# Patient Record
Sex: Female | Born: 1970 | Race: White | Hispanic: Yes | Marital: Married | Smoking: Never smoker
Health system: Southern US, Community
[De-identification: ages and names within clinical notes are randomized; demographics above are authoritative.]

## PROBLEM LIST (undated history)

## (undated) DIAGNOSIS — F99 Mental disorder, not otherwise specified: Secondary | ICD-10-CM

## (undated) DIAGNOSIS — F32A Depression, unspecified: Secondary | ICD-10-CM

## (undated) DIAGNOSIS — B019 Varicella without complication: Secondary | ICD-10-CM

## (undated) HISTORY — DX: Depression, unspecified: F32.A

## (undated) HISTORY — DX: Mental disorder, not otherwise specified: F99

## (undated) HISTORY — DX: Varicella without complication: B01.9

---

## 2011-01-17 ENCOUNTER — Other Ambulatory Visit: Payer: Self-pay | Admitting: Obstetrics & Gynecology

## 2011-01-17 ENCOUNTER — Ambulatory Visit: Payer: Self-pay

## 2011-01-17 ENCOUNTER — Ambulatory Visit (HOSPITAL_COMMUNITY)
Admission: RE | Admit: 2011-01-17 | Discharge: 2011-01-17 | Disposition: A | Discharge status: Home / Self Care | Payer: Medicaid Other | Source: Ambulatory Visit | Attending: Obstetrics & Gynecology | Admitting: Obstetrics & Gynecology

## 2011-01-17 ENCOUNTER — Encounter (INDEPENDENT_AMBULATORY_CARE_PROVIDER_SITE_OTHER): Payer: Self-pay | Admitting: *Deleted

## 2011-01-17 DIAGNOSIS — O26849 Uterine size-date discrepancy, unspecified trimester: Secondary | ICD-10-CM

## 2011-01-17 DIAGNOSIS — O99210 Obesity complicating pregnancy, unspecified trimester: Secondary | ICD-10-CM | POA: Insufficient documentation

## 2011-01-17 DIAGNOSIS — Z331 Pregnant state, incidental: Secondary | ICD-10-CM

## 2011-01-17 DIAGNOSIS — E669 Obesity, unspecified: Secondary | ICD-10-CM | POA: Insufficient documentation

## 2011-01-17 DIAGNOSIS — O0930 Supervision of pregnancy with insufficient antenatal care, unspecified trimester: Secondary | ICD-10-CM | POA: Insufficient documentation

## 2011-01-17 DIAGNOSIS — O09529 Supervision of elderly multigravida, unspecified trimester: Secondary | ICD-10-CM | POA: Insufficient documentation

## 2011-01-17 DIAGNOSIS — O30009 Twin pregnancy, unspecified number of placenta and unspecified number of amniotic sacs, unspecified trimester: Secondary | ICD-10-CM | POA: Insufficient documentation

## 2011-01-17 LAB — CONVERTED CEMR LAB
ALT: 12 units/L (ref 0–35)
AST: 17 units/L (ref 0–37)
Albumin: 3.4 g/dL — ABNORMAL LOW (ref 3.5–5.2)
Antibody Screen: NEGATIVE
Calcium: 8.9 mg/dL (ref 8.4–10.5)
Chloride: 107 meq/L (ref 96–112)
Eosinophils Absolute: 0.2 10*3/uL (ref 0.0–0.7)
Eosinophils Relative: 1 % (ref 0–5)
HCT: 36 % (ref 36.0–46.0)
Hepatitis B Surface Ag: NEGATIVE
Lymphs Abs: 2.5 10*3/uL (ref 0.7–4.0)
MCV: 80 fL (ref 78.0–100.0)
Monocytes Relative: 6 % (ref 3–12)
Platelets: 194 10*3/uL (ref 150–400)
Potassium: 4.7 meq/L (ref 3.5–5.3)
Rh Type: POSITIVE
WBC: 12.8 10*3/uL — ABNORMAL HIGH (ref 4.0–10.5)

## 2011-01-18 ENCOUNTER — Encounter: Payer: Self-pay | Admitting: Family

## 2011-01-18 ENCOUNTER — Other Ambulatory Visit: Payer: Self-pay

## 2011-01-18 ENCOUNTER — Ambulatory Visit (HOSPITAL_COMMUNITY)
Admission: RE | Admit: 2011-01-18 | Discharge: 2011-01-18 | Disposition: A | Discharge status: Home / Self Care | Payer: Medicaid Other | Source: Ambulatory Visit | Attending: Family Medicine | Admitting: Family Medicine

## 2011-01-18 ENCOUNTER — Encounter: Payer: Self-pay | Admitting: Obstetrics and Gynecology

## 2011-01-18 ENCOUNTER — Other Ambulatory Visit: Payer: Self-pay | Admitting: Family Medicine

## 2011-01-18 DIAGNOSIS — O30009 Twin pregnancy, unspecified number of placenta and unspecified number of amniotic sacs, unspecified trimester: Secondary | ICD-10-CM | POA: Insufficient documentation

## 2011-01-18 DIAGNOSIS — O0930 Supervision of pregnancy with insufficient antenatal care, unspecified trimester: Secondary | ICD-10-CM | POA: Insufficient documentation

## 2011-01-18 DIAGNOSIS — E669 Obesity, unspecified: Secondary | ICD-10-CM | POA: Insufficient documentation

## 2011-01-18 DIAGNOSIS — O169 Unspecified maternal hypertension, unspecified trimester: Secondary | ICD-10-CM

## 2011-01-18 DIAGNOSIS — O09529 Supervision of elderly multigravida, unspecified trimester: Secondary | ICD-10-CM | POA: Insufficient documentation

## 2011-01-18 LAB — POCT URINALYSIS DIPSTICK
Bilirubin Urine: NEGATIVE
Hgb urine dipstick: NEGATIVE
Nitrite: POSITIVE — AB
Urine Glucose, Fasting: NEGATIVE mg/dL

## 2011-01-18 LAB — CONVERTED CEMR LAB: Protein, 24H Urine: 198 mg/24hr — ABNORMAL HIGH (ref 50–100)

## 2011-01-19 ENCOUNTER — Encounter: Payer: Self-pay | Admitting: Family

## 2011-01-19 DIAGNOSIS — O99810 Abnormal glucose complicating pregnancy: Secondary | ICD-10-CM

## 2011-01-19 DIAGNOSIS — O10019 Pre-existing essential hypertension complicating pregnancy, unspecified trimester: Secondary | ICD-10-CM

## 2011-01-19 DIAGNOSIS — O30009 Twin pregnancy, unspecified number of placenta and unspecified number of amniotic sacs, unspecified trimester: Secondary | ICD-10-CM

## 2011-01-20 ENCOUNTER — Other Ambulatory Visit: Payer: Self-pay

## 2011-01-20 ENCOUNTER — Other Ambulatory Visit: Payer: Self-pay | Admitting: Obstetrics & Gynecology

## 2011-01-20 ENCOUNTER — Encounter: Payer: Self-pay | Admitting: Obstetrics & Gynecology

## 2011-01-20 DIAGNOSIS — O139 Gestational [pregnancy-induced] hypertension without significant proteinuria, unspecified trimester: Secondary | ICD-10-CM

## 2011-01-20 DIAGNOSIS — IMO0001 Reserved for inherently not codable concepts without codable children: Secondary | ICD-10-CM

## 2011-01-23 ENCOUNTER — Other Ambulatory Visit: Payer: Self-pay

## 2011-01-23 ENCOUNTER — Other Ambulatory Visit: Payer: Self-pay | Admitting: Obstetrics & Gynecology

## 2011-01-23 ENCOUNTER — Ambulatory Visit (HOSPITAL_COMMUNITY)
Admission: RE | Admit: 2011-01-23 | Discharge: 2011-01-23 | Disposition: A | Discharge status: Home / Self Care | Payer: Medicaid Other | Source: Ambulatory Visit | Attending: Obstetrics & Gynecology | Admitting: Obstetrics & Gynecology

## 2011-01-23 DIAGNOSIS — O30009 Twin pregnancy, unspecified number of placenta and unspecified number of amniotic sacs, unspecified trimester: Secondary | ICD-10-CM

## 2011-01-23 DIAGNOSIS — O0930 Supervision of pregnancy with insufficient antenatal care, unspecified trimester: Secondary | ICD-10-CM | POA: Insufficient documentation

## 2011-01-23 DIAGNOSIS — O99330 Smoking (tobacco) complicating pregnancy, unspecified trimester: Secondary | ICD-10-CM | POA: Insufficient documentation

## 2011-01-23 DIAGNOSIS — O99210 Obesity complicating pregnancy, unspecified trimester: Secondary | ICD-10-CM | POA: Insufficient documentation

## 2011-01-23 DIAGNOSIS — IMO0001 Reserved for inherently not codable concepts without codable children: Secondary | ICD-10-CM

## 2011-01-23 DIAGNOSIS — O09529 Supervision of elderly multigravida, unspecified trimester: Secondary | ICD-10-CM | POA: Insufficient documentation

## 2011-01-23 DIAGNOSIS — E669 Obesity, unspecified: Secondary | ICD-10-CM | POA: Insufficient documentation

## 2011-01-23 LAB — POCT URINALYSIS DIPSTICK
Hgb urine dipstick: NEGATIVE
Ketones, ur: NEGATIVE mg/dL
Protein, ur: 100 mg/dL — AB
Specific Gravity, Urine: 1.02 (ref 1.005–1.030)

## 2011-01-26 ENCOUNTER — Ambulatory Visit (HOSPITAL_COMMUNITY)
Admission: RE | Admit: 2011-01-26 | Discharge: 2011-01-26 | Disposition: A | Discharge status: Home / Self Care | Payer: Medicaid Other | Source: Ambulatory Visit | Attending: Obstetrics & Gynecology | Admitting: Obstetrics & Gynecology

## 2011-01-26 ENCOUNTER — Other Ambulatory Visit: Payer: Self-pay | Admitting: Obstetrics & Gynecology

## 2011-01-26 ENCOUNTER — Encounter (HOSPITAL_COMMUNITY): Payer: Self-pay

## 2011-01-26 DIAGNOSIS — O30009 Twin pregnancy, unspecified number of placenta and unspecified number of amniotic sacs, unspecified trimester: Secondary | ICD-10-CM | POA: Insufficient documentation

## 2011-01-26 DIAGNOSIS — O09529 Supervision of elderly multigravida, unspecified trimester: Secondary | ICD-10-CM | POA: Insufficient documentation

## 2011-01-26 DIAGNOSIS — E669 Obesity, unspecified: Secondary | ICD-10-CM | POA: Insufficient documentation

## 2011-01-26 DIAGNOSIS — O99330 Smoking (tobacco) complicating pregnancy, unspecified trimester: Secondary | ICD-10-CM | POA: Insufficient documentation

## 2011-01-26 DIAGNOSIS — IMO0001 Reserved for inherently not codable concepts without codable children: Secondary | ICD-10-CM

## 2011-01-26 DIAGNOSIS — O0930 Supervision of pregnancy with insufficient antenatal care, unspecified trimester: Secondary | ICD-10-CM | POA: Insufficient documentation

## 2011-01-30 ENCOUNTER — Other Ambulatory Visit: Payer: Self-pay

## 2011-01-30 DIAGNOSIS — O34219 Maternal care for unspecified type scar from previous cesarean delivery: Secondary | ICD-10-CM

## 2011-01-30 DIAGNOSIS — O30009 Twin pregnancy, unspecified number of placenta and unspecified number of amniotic sacs, unspecified trimester: Secondary | ICD-10-CM

## 2011-01-30 DIAGNOSIS — Z87891 Personal history of nicotine dependence: Secondary | ICD-10-CM

## 2011-01-30 DIAGNOSIS — O09529 Supervision of elderly multigravida, unspecified trimester: Secondary | ICD-10-CM

## 2011-01-30 LAB — POCT URINALYSIS DIPSTICK
Glucose, UA: NEGATIVE mg/dL
Hgb urine dipstick: NEGATIVE
Nitrite: NEGATIVE
Urobilinogen, UA: 0.2 mg/dL (ref 0.0–1.0)

## 2011-01-31 ENCOUNTER — Ambulatory Visit (HOSPITAL_COMMUNITY)
Admission: RE | Admit: 2011-01-31 | Discharge: 2011-01-31 | Payer: Medicaid Other | Source: Ambulatory Visit | Attending: Family Medicine | Admitting: Family Medicine

## 2011-01-31 ENCOUNTER — Other Ambulatory Visit: Payer: Self-pay | Admitting: Family Medicine

## 2011-01-31 DIAGNOSIS — O24419 Gestational diabetes mellitus in pregnancy, unspecified control: Secondary | ICD-10-CM

## 2011-01-31 DIAGNOSIS — IMO0001 Reserved for inherently not codable concepts without codable children: Secondary | ICD-10-CM

## 2011-02-02 ENCOUNTER — Encounter (HOSPITAL_COMMUNITY)
Admission: RE | Admit: 2011-02-02 | Discharge: 2011-02-02 | Disposition: A | Discharge status: Home / Self Care | Payer: Medicaid Other | Source: Ambulatory Visit | Attending: Obstetrics and Gynecology | Admitting: Obstetrics and Gynecology

## 2011-02-02 ENCOUNTER — Other Ambulatory Visit: Payer: Self-pay | Admitting: Family Medicine

## 2011-02-02 ENCOUNTER — Ambulatory Visit (HOSPITAL_COMMUNITY)
Admission: RE | Admit: 2011-02-02 | Discharge: 2011-02-02 | Disposition: A | Discharge status: Home / Self Care | Payer: Medicaid Other | Source: Ambulatory Visit | Attending: Family Medicine | Admitting: Family Medicine

## 2011-02-02 DIAGNOSIS — IMO0001 Reserved for inherently not codable concepts without codable children: Secondary | ICD-10-CM

## 2011-02-02 DIAGNOSIS — O30009 Twin pregnancy, unspecified number of placenta and unspecified number of amniotic sacs, unspecified trimester: Secondary | ICD-10-CM | POA: Insufficient documentation

## 2011-02-02 DIAGNOSIS — O34219 Maternal care for unspecified type scar from previous cesarean delivery: Secondary | ICD-10-CM | POA: Insufficient documentation

## 2011-02-02 DIAGNOSIS — O24419 Gestational diabetes mellitus in pregnancy, unspecified control: Secondary | ICD-10-CM

## 2011-02-02 DIAGNOSIS — O09529 Supervision of elderly multigravida, unspecified trimester: Secondary | ICD-10-CM | POA: Insufficient documentation

## 2011-02-02 DIAGNOSIS — O0930 Supervision of pregnancy with insufficient antenatal care, unspecified trimester: Secondary | ICD-10-CM | POA: Insufficient documentation

## 2011-02-02 DIAGNOSIS — E669 Obesity, unspecified: Secondary | ICD-10-CM | POA: Insufficient documentation

## 2011-02-02 LAB — CBC
MCH: 24.4 pg — ABNORMAL LOW (ref 26.0–34.0)
Platelets: 198 10*3/uL (ref 150–400)
RBC: 4.79 MIL/uL (ref 3.87–5.11)

## 2011-02-02 LAB — SURGICAL PCR SCREEN: Staphylococcus aureus: NEGATIVE

## 2011-02-06 ENCOUNTER — Other Ambulatory Visit: Payer: Self-pay

## 2011-02-06 ENCOUNTER — Inpatient Hospital Stay (HOSPITAL_COMMUNITY)
Admission: AD | Admit: 2011-02-06 | Discharge: 2011-02-06 | Disposition: A | Discharge status: Home / Self Care | Payer: Medicaid Other | Source: Ambulatory Visit | Attending: Obstetrics and Gynecology | Admitting: Obstetrics and Gynecology

## 2011-02-06 ENCOUNTER — Inpatient Hospital Stay (HOSPITAL_COMMUNITY): Payer: Medicaid Other

## 2011-02-06 DIAGNOSIS — O99891 Other specified diseases and conditions complicating pregnancy: Secondary | ICD-10-CM | POA: Insufficient documentation

## 2011-02-06 DIAGNOSIS — R030 Elevated blood-pressure reading, without diagnosis of hypertension: Secondary | ICD-10-CM

## 2011-02-06 DIAGNOSIS — O169 Unspecified maternal hypertension, unspecified trimester: Secondary | ICD-10-CM

## 2011-02-06 DIAGNOSIS — O9989 Other specified diseases and conditions complicating pregnancy, childbirth and the puerperium: Secondary | ICD-10-CM

## 2011-02-06 DIAGNOSIS — O30009 Twin pregnancy, unspecified number of placenta and unspecified number of amniotic sacs, unspecified trimester: Secondary | ICD-10-CM

## 2011-02-06 LAB — CBC
MCH: 24.4 pg — ABNORMAL LOW (ref 26.0–34.0)
MCHC: 31.3 g/dL (ref 30.0–36.0)
Platelets: 216 10*3/uL (ref 150–400)
RBC: 4.64 MIL/uL (ref 3.87–5.11)
RDW: 16 % — ABNORMAL HIGH (ref 11.5–15.5)

## 2011-02-06 LAB — COMPREHENSIVE METABOLIC PANEL
Alkaline Phosphatase: 238 U/L — ABNORMAL HIGH (ref 39–117)
BUN: 7 mg/dL (ref 6–23)
CO2: 22 mEq/L (ref 19–32)
Chloride: 108 mEq/L (ref 96–112)
Creatinine, Ser: 0.75 mg/dL (ref 0.4–1.2)
GFR calc non Af Amer: >60 mL/min (ref >60)
Total Bilirubin: 0.2 mg/dL — ABNORMAL LOW (ref 0.3–1.2)

## 2011-02-06 LAB — PROTEIN / CREATININE RATIO, URINE
Creatinine, Urine: 146.8 mg/dL
Total Protein, Urine: 60 mg/dL

## 2011-02-06 LAB — POCT URINALYSIS DIP (DEVICE)
Glucose, UA: NEGATIVE mg/dL
Hgb urine dipstick: NEGATIVE
Nitrite: NEGATIVE
Urobilinogen, UA: 0.2 mg/dL (ref 0.0–1.0)
pH: 6.5 (ref 5.0–8.0)

## 2011-02-06 LAB — URINALYSIS, ROUTINE W REFLEX MICROSCOPIC
Ketones, ur: NEGATIVE mg/dL
Leukocytes, UA: NEGATIVE
Nitrite: NEGATIVE
Protein, ur: 30 mg/dL — AB
Urobilinogen, UA: 0.2 mg/dL (ref 0.0–1.0)

## 2011-02-07 ENCOUNTER — Inpatient Hospital Stay (HOSPITAL_COMMUNITY)
Admission: RE | Admit: 2011-02-07 | Discharge: 2011-02-10 | DRG: 765 | Disposition: A | Discharge status: Home / Self Care | Payer: Medicaid Other | Source: Ambulatory Visit | Attending: Obstetrics and Gynecology | Admitting: Obstetrics and Gynecology

## 2011-02-07 ENCOUNTER — Other Ambulatory Visit: Payer: Self-pay | Admitting: Obstetrics and Gynecology

## 2011-02-07 DIAGNOSIS — D649 Anemia, unspecified: Secondary | ICD-10-CM | POA: Diagnosis not present

## 2011-02-07 DIAGNOSIS — O34219 Maternal care for unspecified type scar from previous cesarean delivery: Principal | ICD-10-CM | POA: Diagnosis present

## 2011-02-07 DIAGNOSIS — E669 Obesity, unspecified: Secondary | ICD-10-CM | POA: Diagnosis present

## 2011-02-07 DIAGNOSIS — O30009 Twin pregnancy, unspecified number of placenta and unspecified number of amniotic sacs, unspecified trimester: Secondary | ICD-10-CM | POA: Diagnosis present

## 2011-02-07 DIAGNOSIS — O30049 Twin pregnancy, dichorionic/diamniotic, unspecified trimester: Secondary | ICD-10-CM

## 2011-02-07 DIAGNOSIS — O329XX0 Maternal care for malpresentation of fetus, unspecified, not applicable or unspecified: Secondary | ICD-10-CM | POA: Diagnosis present

## 2011-02-07 DIAGNOSIS — O9903 Anemia complicating the puerperium: Secondary | ICD-10-CM | POA: Diagnosis not present

## 2011-02-07 DIAGNOSIS — O09529 Supervision of elderly multigravida, unspecified trimester: Secondary | ICD-10-CM | POA: Diagnosis present

## 2011-02-07 DIAGNOSIS — O3090 Multiple gestation, unspecified, unspecified trimester: Secondary | ICD-10-CM | POA: Diagnosis present

## 2011-02-07 DIAGNOSIS — O99214 Obesity complicating childbirth: Secondary | ICD-10-CM

## 2011-02-07 LAB — TYPE AND SCREEN

## 2011-02-08 LAB — CBC
Hemoglobin: 7.9 g/dL — ABNORMAL LOW (ref 12.0–15.0)
MCH: 24.7 pg — ABNORMAL LOW (ref 26.0–34.0)
RBC: 3.2 MIL/uL — ABNORMAL LOW (ref 3.87–5.11)
WBC: 14.4 10*3/uL — ABNORMAL HIGH (ref 4.0–10.5)

## 2011-02-10 ENCOUNTER — Encounter (HOSPITAL_COMMUNITY)
Admission: RE | Admit: 2011-02-10 | Discharge: 2011-02-10 | Disposition: A | Discharge status: Home / Self Care | Payer: Medicaid Other | Source: Ambulatory Visit | Attending: Obstetrics and Gynecology | Admitting: Obstetrics and Gynecology

## 2011-02-10 DIAGNOSIS — O923 Agalactia: Secondary | ICD-10-CM | POA: Insufficient documentation

## 2011-02-12 ENCOUNTER — Inpatient Hospital Stay (HOSPITAL_COMMUNITY)
Admission: EM | Admit: 2011-02-12 | Discharge: 2011-02-13 | DRG: 776 | Disposition: A | Discharge status: Home / Self Care | Payer: Medicaid Other | Attending: Internal Medicine | Admitting: Internal Medicine

## 2011-02-12 ENCOUNTER — Emergency Department (HOSPITAL_COMMUNITY): Payer: Medicaid Other

## 2011-02-12 DIAGNOSIS — D649 Anemia, unspecified: Secondary | ICD-10-CM | POA: Diagnosis present

## 2011-02-12 DIAGNOSIS — E8809 Other disorders of plasma-protein metabolism, not elsewhere classified: Secondary | ICD-10-CM | POA: Diagnosis present

## 2011-02-12 DIAGNOSIS — I509 Heart failure, unspecified: Secondary | ICD-10-CM | POA: Diagnosis present

## 2011-02-12 DIAGNOSIS — O99893 Other specified diseases and conditions complicating puerperium: Secondary | ICD-10-CM | POA: Diagnosis present

## 2011-02-12 DIAGNOSIS — I5031 Acute diastolic (congestive) heart failure: Secondary | ICD-10-CM | POA: Diagnosis present

## 2011-02-12 DIAGNOSIS — F17200 Nicotine dependence, unspecified, uncomplicated: Secondary | ICD-10-CM | POA: Diagnosis present

## 2011-02-12 LAB — CBC
HCT: 25.7 % — ABNORMAL LOW (ref 36.0–46.0)
Hemoglobin: 8.1 g/dL — ABNORMAL LOW (ref 12.0–15.0)
MCH: 25.4 pg — ABNORMAL LOW (ref 26.0–34.0)
MCHC: 31.5 g/dL (ref 30.0–36.0)
MCV: 80.6 fL (ref 78.0–100.0)
RBC: 3.19 MIL/uL — ABNORMAL LOW (ref 3.87–5.11)

## 2011-02-12 LAB — POCT I-STAT, CHEM 8
BUN: 9 mg/dL (ref 6–23)
Calcium, Ion: 1.02 mmol/L — ABNORMAL LOW (ref 1.12–1.32)
Creatinine, Ser: 0.7 mg/dL (ref 0.4–1.2)
Glucose, Bld: 85 mg/dL (ref 70–99)
HCT: 25 % — ABNORMAL LOW (ref 36.0–46.0)
Hemoglobin: 8.5 g/dL — ABNORMAL LOW (ref 12.0–15.0)
Sodium: 142 mEq/L (ref 135–145)

## 2011-02-12 LAB — BASIC METABOLIC PANEL
CO2: 25 mEq/L (ref 19–32)
Calcium: 8 mg/dL — ABNORMAL LOW (ref 8.4–10.5)
Creatinine, Ser: 0.82 mg/dL (ref 0.4–1.2)
GFR calc Af Amer: >60 mL/min (ref >60)
GFR calc non Af Amer: >60 mL/min (ref >60)
Sodium: 139 mEq/L (ref 135–145)

## 2011-02-12 LAB — CK TOTAL AND CKMB (NOT AT ARMC)
CK, MB: 1.3 ng/mL (ref 0.3–4.0)
Relative Index: INVALID (ref 0.0–2.5)
Total CK: 80 U/L (ref 7–177)

## 2011-02-12 LAB — URINALYSIS, ROUTINE W REFLEX MICROSCOPIC
Bilirubin Urine: NEGATIVE
Glucose, UA: NEGATIVE mg/dL
Ketones, ur: NEGATIVE mg/dL
Protein, ur: NEGATIVE mg/dL
Urobilinogen, UA: 0.2 mg/dL (ref 0.0–1.0)

## 2011-02-12 LAB — DIFFERENTIAL
Basophils Relative: 1 % (ref 0–1)
Lymphocytes Relative: 15 % (ref 12–46)
Lymphs Abs: 1.9 10*3/uL (ref 0.7–4.0)
Monocytes Absolute: 0.8 10*3/uL (ref 0.1–1.0)
Monocytes Relative: 6 % (ref 3–12)
Neutro Abs: 9.5 10*3/uL — ABNORMAL HIGH (ref 1.7–7.7)
Neutrophils Relative %: 76 % (ref 43–77)

## 2011-02-12 LAB — HEPATIC FUNCTION PANEL
ALT: 21 U/L (ref 0–35)
AST: 30 U/L (ref 0–37)
Indirect Bilirubin: 0.6 mg/dL (ref 0.3–0.9)
Total Protein: 5.3 g/dL — ABNORMAL LOW (ref 6.0–8.3)

## 2011-02-12 LAB — TYPE AND SCREEN: Antibody Screen: NEGATIVE

## 2011-02-12 LAB — POCT CARDIAC MARKERS
CKMB, poc: <1 ng/mL — ABNORMAL LOW (ref 1.0–8.0)
Myoglobin, poc: 62.4 ng/mL (ref 12–200)

## 2011-02-12 LAB — TSH: TSH: 3.768 u[IU]/mL (ref 0.350–4.500)

## 2011-02-12 LAB — HEMOGLOBIN A1C
Hgb A1c MFr Bld: 5.8 % — ABNORMAL HIGH (ref <5.7)
Mean Plasma Glucose: 120 mg/dL — ABNORMAL HIGH (ref <117)

## 2011-02-12 MED ORDER — IOHEXOL 300 MG/ML  SOLN
100.0000 mL | Freq: Once | INTRAMUSCULAR | Status: AC | PRN
  Administered 2011-02-12: 100 mL via INTRAVENOUS

## 2011-02-12 MED ORDER — IOHEXOL 300 MG/ML  SOLN: 100.0000 mL | Freq: Once | INTRAMUSCULAR | Status: AC | PRN

## 2011-02-13 ENCOUNTER — Inpatient Hospital Stay (HOSPITAL_COMMUNITY): Payer: Medicaid Other

## 2011-02-13 LAB — BASIC METABOLIC PANEL
Calcium: 7.8 mg/dL — ABNORMAL LOW (ref 8.4–10.5)
GFR calc non Af Amer: >60 mL/min (ref >60)
Glucose, Bld: 95 mg/dL (ref 70–99)
Sodium: 141 mEq/L (ref 135–145)

## 2011-02-13 LAB — CBC
MCH: 24.7 pg — ABNORMAL LOW (ref 26.0–34.0)
MCHC: 30.8 g/dL (ref 30.0–36.0)
RDW: 17.2 % — ABNORMAL HIGH (ref 11.5–15.5)

## 2011-02-13 LAB — CK TOTAL AND CKMB (NOT AT ARMC): CK, MB: 0.8 ng/mL (ref 0.3–4.0)

## 2011-02-15 ENCOUNTER — Ambulatory Visit: Payer: Self-pay | Admitting: Obstetrics & Gynecology

## 2011-02-17 ENCOUNTER — Ambulatory Visit: Payer: Self-pay | Admitting: Obstetrics & Gynecology

## 2011-02-17 DIAGNOSIS — I10 Essential (primary) hypertension: Secondary | ICD-10-CM

## 2011-02-17 LAB — WOUND CULTURE

## 2011-02-21 NOTE — Progress Notes (Signed)
NAME:  [name], [name]                ACCOUNT NO.:  [id number]  MEDICAL RECORD NO.:  [id number]           PATIENT TYPE:  LOCATION:  [facility]                     FACILITY:  PHYSICIAN:  Scheryl Darter, MD       DATE OF BIRTH:  1971/07/06  DATE OF SERVICE:                                 CLINIC NOTE  The patient is postoperative from a cesarean section for delivery of twins on February 08, 2011.  Baby is doing well.  The patient had hypertension and edema.  She was discharged with prescription for furosemide 20 mg 3 tablets p.o. daily and Klor-Con 20 mEq daily.  She says the swelling is improving, but still there is considerable amount of swelling in her lower extremities and she still has some symptoms of carpal tunnel syndrome on the right.  No headaches or visual changes or abdominal pain or shortness of breath.  She says incision seems to be healing well.  PHYSICAL EXAMINATION:  She is in no acute distress.  She has 3+ swelling in her lower extremities.  Her incision is healing well with just a small amount of superficial separation on the right side which is granulating well.  IMPRESSION:  Edema, hypertension, and postpartum.  PLAN:  She will continue with her furosemide.  I gave her a prescription for amlodipine 5 mg p.o. daily.  She will return in 2-3 weeks to check her blood pressure.  We could consider placing a Mirena at that time.     Scheryl Darter, MD    JA/MEDQ  D:  02/17/2011  T:  02/18/2011  Job:  [id number]

## 2011-02-27 NOTE — Op Note (Addendum)
[name], [name]                ACCOUNT NO.:  [id number]  MEDICAL RECORD NO.:  [id number]           PATIENT TYPE:  I  LOCATION:  9105                          FACILITY:  [facility]  PHYSICIAN:  Catalina Antigua, MD     DATE OF BIRTH:  29-Mar-1971  DATE OF PROCEDURE:  02/07/2011 DATE OF DISCHARGE:                              OPERATIVE REPORT   PREOPERATIVE DIAGNOSES: 1. Intrauterine pregnancy at 38 weeks and 3 days. 2. Dichorionic-diamniotic twins. 3. Breech twin A and B. 4. Previous cesarean section x2. 5. Desires fertility.  POSTOPERATIVE DIAGNOSES: 1. Intrauterine pregnancy at 38 weeks and 3 days. 2. Dichorionic-diamniotic twins. 3. Breech twin A and B. 4. Previous cesarean section x2. 5. Desires fertility.  PROCEDURE:  Repeat low transverse cesarean section via Pfannenstiel.  SURGEON:  Catalina Antigua, MD, and Maryelizabeth Kaufmann, MD  ANESTHESIA:  Spinal.  IV FLUIDS:  3 L.  URINE OUTPUT:  200 mL of clear urine at the end of the procedure.  ESTIMATED BLOOD LOSS:  1100.  FINDINGS:  A viable female infant, twin A found to be in frank breech presentation with clear amniotic fluid.  Apgars were 8 and 9, weight was 7 pounds 2 ounces and cord pH was 7.16.  Subsequently twin B was delivered viable female, footling breech, meconium amniotic fluid. Apgars were 9 and 9, weight was 6 pounds 14 ounces.  Cord pH was 7.06. Normal uterus and adnexa bilaterally.  SPECIMENS:  Placenta to Pathology.  COMPLICATIONS:  None immediate.  INDICATIONS:  This is a 40 year old gravida 6, para 3-0-2-3 with a twin intrauterine pregnancy at 38 weeks and 3 days, di-di twins, most recent ultrasound as of March 19 with breech twin A and twin B.  The patient desired, thus the patient was scheduled for a repeat cesarean section. The patient was not in labor at the time of admission and she was otherwise asymptomatic.  Prenatal history which showed notable for advanced maternal age and morbid obesity  and possible chronic hypertension for which she was never started on any medication.  Blood pressure was well controlled and on admission it was 144/82.  PROCEDURE IN DETAIL:  After consent was obtained, the patient was taken back to the operative suite where spinal anesthesia was then placed and the anesthesia was found to be adequate.  The patient was placed in dorsal supine position with a leftward tilt.  The patient was prepped and draped in normal sterile fashion.  Again anesthesia was tested, was found to be adequate.  A Pfannenstiel skin incision was then made with a scalpel and carried down through to the underlying fascia.  The fascia was then incised in the midline.  Fascial incision was then extended laterally with the Mayo scissors.  The superior aspect of the fascial incision was then grasped with Kocher clamps, elevated, tented up and the rectus muscles were then dissected off bluntly.  Attention was then turned to the inferior aspect of the incision which in similar fashion was grasped with Kocher clamps, elevated, tented up and the rectus muscles were then dissected off bluntly.  The rectus muscles were then separated  in the midline.  There were some adhesions which were lysed with Bovie.  The peritoneum was then entered bluntly.  There were noted to be some mild anterior uterine adhesions that were lysed with a Bovie and an Alexis O ring retractor was then inserted into the abdomen.  The lower uterine segment was then incised in a transverse fashion with a scalpel.  The incision was then extended manually.  After incision, there was found to be an anterior placenta twin A, amniotomy was then performed.  Infant was otherwise delivered in frank breech presentation. Mouth and nose were bulb suctioned.  Cord was cut and clamped and infant was handed off to awaiting NICU.  Apgars were 8 and 9, weight was 7 pounds 2 ounces and cord pH was 7.16.  Subsequently after delivery  of twin A, twin B; amniotomy was then performed with meconium-stained amniotic fluid.  Infant was found to be in footling breech presentation was delivered, otherwise in breech presentation in normal fashion. Mouth and nose were bulb suctioned.  Cord was cut and clamped and the infant was handed off to awaiting NICU wherein Apgars were 9 and 9, weight was 6 pounds 14 ounces and cord pH was 7.06.  cord blood was then sampled.  Cord pH was then sampled as previously stated.  Placenta was then delivered spontaneously intact with three-vessel cord.  Placenta was then sent to Pathology.  The uterus was then cleared of all clots and debris.  The uterine incision was then closed with 0 Vicryl in a two- layer closure with the initial layer being running interlocking and the second layer being imbricating in a running fashion.  The incision was then found to be adequate.  The peritoneum was then irrigated and cleared of all clots and debris.  Bilateral adnexa were then visualized and were found to be normal.  The Alexis O ring retractor was then removed.  Hemostasis again was found to be adequate at the uterine incision.  The fascia was then closed with an 0 Vicryl in a running fashion.  The subcutaneous tissue was then irrigated, was found to be hemostatic.  It was then reapproximated with plain gut suture and the skin was then closed with staples.  The patient tolerated the procedure. The patient did receive antibiotics perioperatively.  Lap, needle and sponge counts were correct x2 and the patient was taken back to the recovery area in stable condition.    ______________________________ Maryelizabeth Kaufmann, MD   ______________________________ Catalina Antigua, MD    LC/MEDQ  D:  02/07/2011  T:  02/08/2011  Job:  [id number]  Electronically Signed by Maryelizabeth Kaufmann MD on 02/27/2011 12:25:17 PM Electronically Signed by Catalina Antigua  on 02/28/2011 10:13:29 AM

## 2011-03-06 NOTE — H&P (Signed)
[name], [name]                ACCOUNT NO.:  [id number]  MEDICAL RECORD NO.:  [id number]           PATIENT TYPE:  E  LOCATION:  [facility]LED                         FACILITY:  [facility]  PHYSICIAN:  Marcellus Scott, MD     DATE OF BIRTH:  1971/09/29  DATE OF ADMISSION:  02/12/2011 DATE OF DISCHARGE:                             HISTORY & PHYSICAL   PRIMARY CARE PHYSICIAN:  The patient does not have a primary care MD.  OBSTETRICIAN:  Catalina Antigua, M.D.  CHIEF COMPLAINTS:  Difficulty breathing and chest discomfort.  HISTORY OF PRESENT ILLNESS:  [name] is a 40 year old morbidly obese female patient status post low transverse cesarean section on February 07, 2011, and delivered twins at 83 weeks' gestation.  She was discharged home.  Two days ago, she noticed dyspnea on exertion which has progressively gotten worse with associated chest tightness which she indicates feels like a bear hugger around her chest.  She denies any cough, fever, or chills.  She had some pain on the left upper back.  The chest discomfort does not radiate and does not have any relieving factors.  Her dyspnea was mostly on exertion and minimal exertion made it worse.  She sat on a reclining chair all night last night which she says was mostly because her twins kept her awake.  For these symptoms, the patient presented to the emergency room where her chest x-ray was suggestive of pulmonary edema.  The patient received 60 mg of IV Lasix and says her dyspnea is getting better but still her breathing is not at baseline.  [facility] hospitalist requested to admit her for further evaluation and management.  The patient does give history of chronic lower extremity significant edema all the way to her lower abdomen and back.  She says the leg edema is actually better than a few days before.  PAST MEDICAL HISTORY: 1. Pregnancy related high blood pressure. 2. Gestational diabetes.  PAST SURGICAL HISTORY:  Low transverse  cesarean section via Pfannenstiel on February 07, 2011.  She has also had a previous cesarean section.  ALLERGIES:  No known drug allergies.  HOME MEDICATIONS: 1. Ibuprofen 800 mg p.o. t.i.d. p.r.n. for pain or cramping. 2. Oxycodone/acetaminophen 5/325 mg, 1-2 tablets p.o. q.4h. p.r.n. for     pain. 3. Nitrofurantoin 100 mg p.o. b.i.d. for 10 days which was started on     January 28, 2011.  FAMILY HISTORY:  Negative.  No history of heart disease.  SOCIAL HISTORY:  The patient is married.  She is a Futures trader.  She is independent of activities of daily living.  She smokes half pack per day for 25 years.  She denies alcohol or drug abuse.  ADVANCE DIRECTIVES:  Full code.  REVIEW OF SYSTEMS:  All systems reviewed and apart from history of presenting illness, significant for mild pain at the incision site but no drainage.  She also has mild vaginal bleeding.  PHYSICAL EXAMINATION:  GENERAL:  [name] is a moderately built and morbidly obese female patient who is sitting upright on the gurney in no obvious distress.  She does have dyspnea on  exertion. VITAL SIGNS:  Temperature is 97.9 degrees Fahrenheit, blood pressure on arrival was 195/101 mmHg but is now 150/83 mmHg, pulse 99 per minute, respiration 20 per minute, and saturating at 97% on room air. HEAD, EYES, AND ENT:  Nontraumatic, normocephalic.  Pupils equally reacting to light and accommodation.  Oral mucosa is moist and no acute findings. NECK:  Supple.  No JVD or carotid bruit. LYMPHATICS:  No lymphadenopathy. RESPIRATORY:  Reduced breath sounds in the bases with bibasilar fine crackles.  Rest of the lung fields are clear.  As indicated, no increased work of breathing at rest but does have dyspnea on exertion. CARDIOVASCULAR:  First and second heart sounds heard.  Regular rate and rhythm.  She has a short 2/6 systolic ejection murmur at the apex.  No rubs, gallops, or clicks. ABDOMEN:  Obese.  The cesarean section incision  across the lower abdomen is clean and intact.  The dressing is slightly soiled.  The patient has pitting edema all the way up to the mid abdomen from the legs.  Abdomen is soft, and normal bowel sounds heard.  No organomegaly or mass appreciated. CENTRAL NERVOUS SYSTEM:  The patient is awake, alert, oriented x3 with no focal neurological deficits. EXTREMITIES:  With 3+ pitting bilateral lower extremity edema all the way up to the mid abdomen.  Lower extremities, power is grade 5/5. MUSCULOSKELETAL:  Negative. SKIN:  Apart from the incision which is described above, there are no other acute findings.  LABORATORY DATA:  D-dimer is 1.75.  Point of care cardiac markers are negative.  BNP 95.  Hepatic panel significant for total protein 5.3 and albumin of 2.4.  Basic metabolic panel on i-STAT within normal limits with BUN of 9, creatinine 0.7, and potassium 4.3.  CBC; hemoglobin 8.1, hematocrit 26, white blood cell 12.5, and platelets 290,000.  Her hemoglobin on February 08, 2011, was 7.9 and white cell of 14,000. Urinalysis is negative for features of urinary tract infection.  Chest x- ray is reported as interstitial edema.  EKG shows sinus tachycardia at 101 beats per minute, normal axis, no acute changes.  ASSESSMENT AND PLAN: 1. Dyspnea, likely secondary to acute diastolic congestive heart     failure precipitated by a recent cesarean section.  We will admit     the patient to telemetry.  Cycle cardiac enzymes.  Given her risk     for venous thromboembolism, we will follow up her CT angiogram of     the chest.  We will continue IV Lasix.  We will obtain a 2-D     echocardiogram to check for her LV function.  Strict input output     chart.  We will monitor progress.  The patient has been instructed     to express and discard her breast milk for 24 hours after the CT     angiogram of the chest. 2. Normocytic anemia which seems chronic and stable.  Follow up CBC in     the a.m. 3. Tobacco  abuse.  Cessation counseling done. 4. Morbid obesity. 5. Recent cesarean section.  We will alert the patient's obstetrician     of her admission. 6. The patient is a full code status.  TIME SPENT:  Time taken in coordinating this history and physical note is 60 minutes.     Marcellus Scott, MD     AH/MEDQ  D:  02/12/2011  T:  02/12/2011  Job:  [id number]  cc:   Catalina Antigua, MD  Electronically  Signed by Marcellus Scott MD on 03/05/2011 10:25:40 PM

## 2011-03-06 NOTE — Discharge Summary (Signed)
[name], [name]                ACCOUNT NO.:  [id number]  MEDICAL RECORD NO.:  [id number]           PATIENT TYPE:  I  LOCATION:  1435                         [facility]ACILITY:  [facility]  PHYSICIAN:  Marcellus Scott, MD     DATE OF BIRTH:  October 27, 1971  DATE OF ADMISSION:  02/12/2011 DATE OF DISCHARGE:  02/13/2011                              DISCHARGE SUMMARY   PRIMARY CARE PHYSICIAN:  The patient does not have a primary care physician.  OBSTETRICIAN:  Catalina Antigua, MD  DISCHARGE DIAGNOSES: 1. Acute diastolic congestive heart failure. 2. Normocytic anemia. 3. Tobacco abuse. 4. Morbid obesity. 5. Recent lower segment cesarean section for delivery of term twins. 6. History of pregnancy-related high blood pressure and gestational     diabetes.  DISCHARGE MEDICATIONS: 1. Furosemide 60 mg p.o. daily. 2. Potassium chloride 20 mEq p.o. daily. 3. Ibuprofen 800 mg p.o. t.i.d. p.r.n. for pain or cramping. 4. Nitrofurantoin 100 mg p.o. b.i.d. to complete the course. 5. Oxycodone/acetaminophen 5/325, 1-2 tablets p.o. q.4 h. p.r.n. for     pain.  IMAGING: 1. Chest x-ray, February 13, 2011:  Impression, decreased pulmonary     interstitial edema since prior exam.  No acute findings. 2. CT angiogram of the chest with contrast.  Impression, no evidence     of pulmonary embolism.  Findings to support the presence of     interstitial edema with associated small bilateral pleural     effusions.  There may also be a component of early airspace edema,     especially in the peripheral lower lung zones.  Some tiny     nonspecific pulmonary nodules are identified, which are likely     benign and postinflammatory given appearance. 3. Chest x-ray, March 25th.  Impression, favor interstitial pulmonary     edema.  Cannot entirely exclude asymmetric involvement of the lung     bases. 4. 2-D echocardiogram shows left ventricular cavity size was normal.     There was mild focal basal hypertrophy of the septum.   Systolic     function was vigorous.  Estimated ejection fraction was in the     range of 65% to 70%.  Wall motion was normal.  There were no     regional wall motion abnormalities.  Doppler parameters are     consistent with abnormal left ventricular relaxation.  The left     atrium was mildly dilated.  PERTINENT LABORATORY DATA:  Abdominal incision site wound culture pending.  BNP 71.  Cardiac enzymes cycled and negative.  Basic metabolic panel unremarkable.  BUN 12, creatinine 0.86.  CBC, hemoglobin 7, hematocrit 25, white blood cell 12.4, platelets 354 with an MCV of 80, TSH is 3.769, hemoglobin A1c 5.8, D-dimer 1.75.  Hepatic panel only significant for total protein 5.3, albumin of 2.4.  Urinalysis was not indicative of urinary tract infection.  Hemoglobin on March 21st was 7.9.  CONSULTATIONS:  None.  DIET:  Low-sodium heart-healthy diet.  ACTIVITY:  Ad lib.  COMPLAINTS TODAY:  None.  The patient indicates that she has no further dyspnea.  She is ambulated without any dyspnea.  She denies any chest pain.  She has had bilateral lower extremity swelling since pregnancy and denies any leg pains.  PHYSICAL EXAMINATION:  GENERAL:  The patient is in no obvious distress. Telemetry shows sinus rhythm with no arrhythmia alarms. VITAL SIGNS:  Temperature is 97.4 degrees Fahrenheit, pulse is 85 per minute, respirations 20 per minute, blood pressure 150/94 mmHg, and saturating at 94% on room air. RESPIRATORY:  With occasional basal crackles, otherwise clear to auscultation and no increased work of breathing.  CARDIOVASCULAR:  First and second heart sounds heard.  Regular rate and rhythm.  No murmurs or JVD. ABDOMEN:  Obese, nontender, soft.  Bowel sounds present.  C-section incision site is clean and dry and intact. CNS:  The patient is awake, alert, oriented x3 with no focal neurological deficits. EXTREMITIES:  With grade 3+ pitting bipedal edema with no acute  signs.  Input/output is not fully recorded because the patient has emptied her urine.  She had a negative balance of 1470 today thus far.  HOSPITAL COURSE:  Ms. Schoening is a pleasant 40 year old female patient status post low-transverse cesarean section on February 15, 2011, when she delivered twins at [redacted] weeks gestation.  2-3 days after this, she started complaining of dyspnea, which progressively got worse with associated chest tightness and she presented to the emergency room where her chest x-ray was suggestive of pulmonary edema.  However, given her high risk for venous thromboembolism, CT scan of the chest was done and pulmonary embolism was ruled out.  She was admitted for further evaluation and management.  PROBLEMS: 1. Acute diastolic congestive heart failure.  This was probably     precipitated by the recent cesarean section and hypoalbuminemia.     She was admitted to Telemetry, which did not show any arrhythmia     alarms.  She was placed on intravenous Lasix and she diuresed     appropriately with resolution of her dyspnea.  She will be     discharged on 10 days of oral Lasix with potassium supplements.     She is advised to follow up with her primary care physician in 5-7     days from discharge and with her obstetrician in 3-5 days from     discharge.  She verbalizes understanding.  This dictator has     discussed with her obstetricians at [facility] regarding her     discharge medications and they indicate that she may have reduced     lactation from the Lasix and have advised that she use over-the-     counter fenugreek 1 tablet p.o. daily.  This has been relayed to     the patient.  Also, the obstetrician will call her with a followup     appointment. 2. Asymptomatic normocytic anemia.  The hemoglobin compared to her     recent discharge from [facility] seems to be stable.  She is     advised to take prenatal vitamins. 3. Tobacco abuse.  Cessation  counseling done.  The patient declined     nicotine patch. 4. Morbid obesity.  Eventually, she will have to diet, exercise, and     lose weight. 5. Recent lower segment cesarean section.  Followup outpatient with     her obstetrician.  DISPOSITION:  The patient was discharged home in stable condition.  FOLLOWUP RECOMMENDATIONS.: 1. With Dr. Catalina Antigua.  The MD's office will call with an     appointment.  She is to be seen with  blood tests including CBC and     basic metabolic panel. 2. With a primary MD of choice.  The patient is to call for an     appointment to be seen in 5-7 days from discharge.  We tried to     assist her with finding a primary care physician, but she declined     that offer and said she will find an MD by herself.  Time taken in coordinating this discharge was 35 minutes.     Marcellus Scott, MD     AH/MEDQ  D:  02/13/2011  T:  02/14/2011  Job:  [id number]  cc:   Catalina Antigua, MD  Electronically Signed by Marcellus Scott MD on 03/05/2011 10:05:37 PM

## 2011-03-13 ENCOUNTER — Encounter (HOSPITAL_COMMUNITY): Payer: Medicaid Other | Attending: Obstetrics and Gynecology

## 2011-03-13 DIAGNOSIS — O923 Agalactia: Secondary | ICD-10-CM | POA: Insufficient documentation

## 2011-03-21 NOTE — Discharge Summary (Signed)
  NAMEALEXIYA, [name]                ACCOUNT NO.:  [id number]  MEDICAL RECORD NO.:  [id number]           PATIENT TYPE:  I  LOCATION:  9105                          FACILITY:  [facility]  PHYSICIAN:  Scheryl Darter, MD       DATE OF BIRTH:  1971-09-16  DATE OF ADMISSION:  02/07/2011 DATE OF DISCHARGE:  02/10/2011                              DISCHARGE SUMMARY   ADMISSION HISTORY:  [name] is a 40 year old G6, P 4-0-2-5 who was admitted at 38 weeks and 3 days for repeat C-section with a twin IUP with breech twin A.  She [facility] and her prenatal course was complicated by gestational diabetes, advanced maternal age, previous C-section x2, di-di twin gestation, morbid obesity, and possible chronic hypertension.She was admitted to the OR upon admission and had a repeat cesarean section that was uncomplicated. Her postpartum course was uncomplicated.  She remained normotensive throughout her stay. Today the patient is ready for discharge with out complaints. Her pain is well controlled with Percocet. She denies lightheadedness, dizziness or heart palpitations. She is ambulating  without difficulty.   O: VSS Abdomen: soft, nontender, and her incision was healing well.  Steri-Strips in place for the incision and staples in place as well as a small amount of serous drainage from incision.  Lochia: scant  Pertinent labs: Hgb 7.9 on 02/08/11  Plan: She was discharged in stable condition on postoperative day #3 on February 10, 2011.  Her pain was well-controlled with Percocet and ibuprofen and was planning for an IUD postpartum. Physical exam on discharge, vital signs were stable.   Her staples were removed upon discharge and Steri-Strips were placed.  She is to follow up in the [facility] in 2 weeks.  Normal postpartum instructions were reviewed including wound care and infection precautions.  She was given prescription for Percocet, Motrin, and Colace  upon discharge.  She will call or return with any problems prior to her to 2 weeks postpartum visit.    ______________________________ Georges Mouse, CNM   ______________________________ Scheryl Darter, MD    NF/MEDQ  D:  03/17/2011  T:  03/17/2011  Job:  [id number]  Electronically Signed by Georges Mouse CNM on 03/20/2011 12:43:28 AM Electronically Signed by Scheryl Darter MD on 03/21/2011 10:59:12 AM

## 2011-03-30 ENCOUNTER — Other Ambulatory Visit: Payer: Self-pay | Admitting: Obstetrics & Gynecology

## 2011-03-30 ENCOUNTER — Ambulatory Visit (INDEPENDENT_AMBULATORY_CARE_PROVIDER_SITE_OTHER): Payer: Medicaid Other | Admitting: Obstetrics & Gynecology

## 2011-03-30 DIAGNOSIS — Z30430 Encounter for insertion of intrauterine contraceptive device: Secondary | ICD-10-CM

## 2011-03-31 NOTE — Group Therapy Note (Signed)
NAME:  [name], [name]                ACCOUNT NO.:  [id number]  MEDICAL RECORD NO.:  [id number]           PATIENT TYPE:  LOCATION:  [facility]                     FACILITY:  PHYSICIAN:  Scheryl Darter, MD       DATE OF BIRTH:  1971-02-25  DATE OF SERVICE:  03/30/2011                                 CLINIC NOTE  The patient returns today to have Mirena IUD placed.  She also for blood pressure check.  The patient has previously been on furosemide.  Blood pressure is 138/88.  She signed consent for placement of Mirena.  She has had a copper IUD some years ago.  Explained the placement of the IUD, the method of contraception, the fact that this will be good for 5 years.  Risks of pain, perforation, bleeding, and infection were discussed.  She will return to check the IUD placement in 1 month.  Time- out was performed.  Speculum was inserted and cervix was visualized. Cervix was prepped.  Cervix was grasped with single-tooth tenaculum. Uterus sounded to 10 cm.  Mirena was placed in the usual fashion without difficulty.  String was trimmed to about 2.5 cm.  All instruments were removed.  She tolerated this well.  She will return in 1 month.  She was given precautions.     Scheryl Darter, MD    JA/MEDQ  D:  03/30/2011  T:  03/31/2011  Job:  [id number]

## 2011-04-13 ENCOUNTER — Encounter (HOSPITAL_COMMUNITY)
Admission: RE | Admit: 2011-04-13 | Discharge: 2011-04-13 | Disposition: A | Discharge status: Home / Self Care | Payer: Medicaid Other | Source: Ambulatory Visit | Attending: Obstetrics and Gynecology | Admitting: Obstetrics and Gynecology

## 2011-04-13 DIAGNOSIS — O923 Agalactia: Secondary | ICD-10-CM | POA: Insufficient documentation

## 2011-04-28 ENCOUNTER — Ambulatory Visit: Payer: Medicaid Other | Admitting: Family Medicine

## 2011-05-14 ENCOUNTER — Encounter (HOSPITAL_COMMUNITY)
Admission: RE | Admit: 2011-05-14 | Discharge: 2011-05-14 | Disposition: A | Discharge status: Home / Self Care | Payer: Medicaid Other | Source: Ambulatory Visit | Attending: Obstetrics and Gynecology | Admitting: Obstetrics and Gynecology

## 2011-05-14 DIAGNOSIS — O923 Agalactia: Secondary | ICD-10-CM | POA: Insufficient documentation

## 2011-06-14 ENCOUNTER — Encounter (HOSPITAL_COMMUNITY)
Admission: RE | Admit: 2011-06-14 | Discharge: 2011-06-14 | Disposition: A | Discharge status: Home / Self Care | Payer: Self-pay | Source: Ambulatory Visit | Attending: Obstetrics and Gynecology | Admitting: Obstetrics and Gynecology

## 2011-06-14 DIAGNOSIS — O923 Agalactia: Secondary | ICD-10-CM | POA: Insufficient documentation

## 2011-07-15 ENCOUNTER — Encounter (HOSPITAL_COMMUNITY)
Admission: RE | Admit: 2011-07-15 | Discharge: 2011-07-15 | Disposition: A | Discharge status: Home / Self Care | Payer: Medicaid Other | Source: Ambulatory Visit | Attending: Obstetrics and Gynecology | Admitting: Obstetrics and Gynecology

## 2011-07-15 DIAGNOSIS — O923 Agalactia: Secondary | ICD-10-CM | POA: Insufficient documentation

## 2011-08-12 IMAGING — US US OB FOLLOW-UP - US FETAL BPP WO NST
1 series · 13 of 15 positions shown · non-contrast
Comparison: none

[Series 1: us ob follow up/us fetal bpp wo nst · 15 acquisitions, 13 frames shown]
[im 1/15]
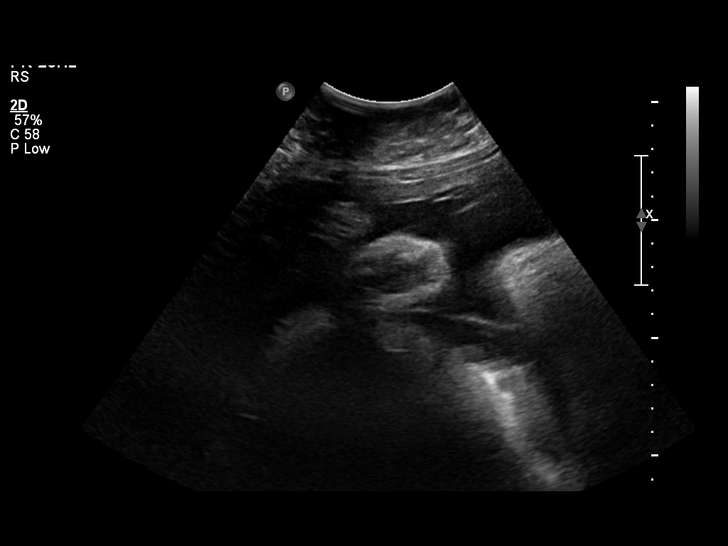
[im 2/15]
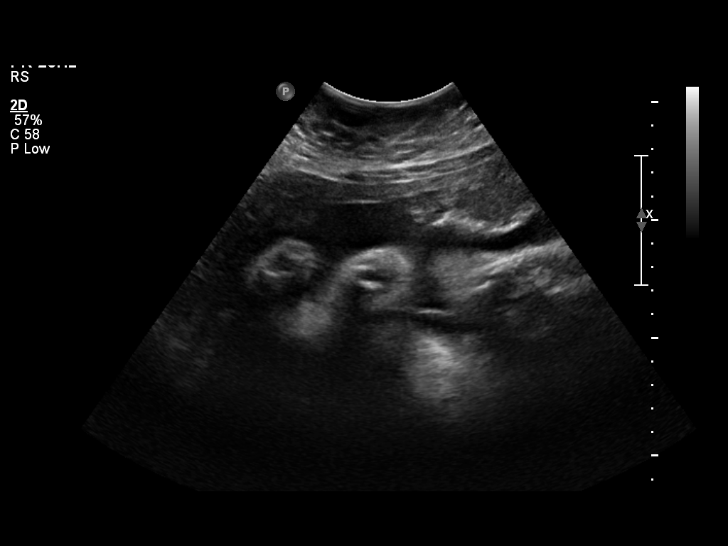
[im 3/15]
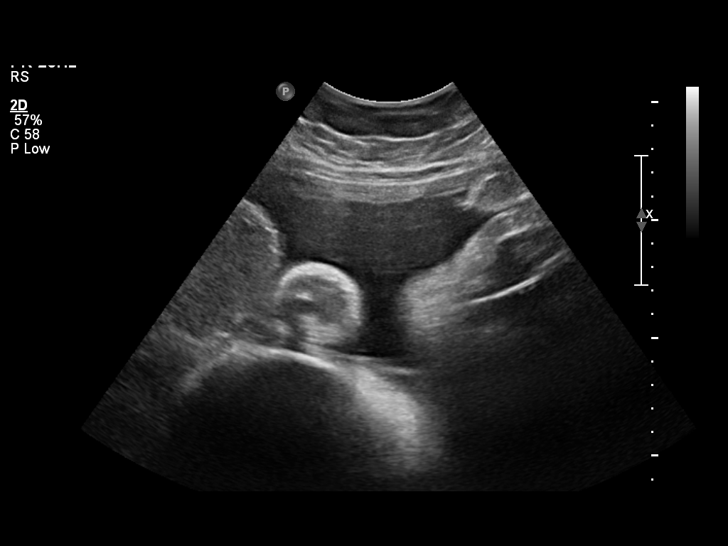
[im 5/15]
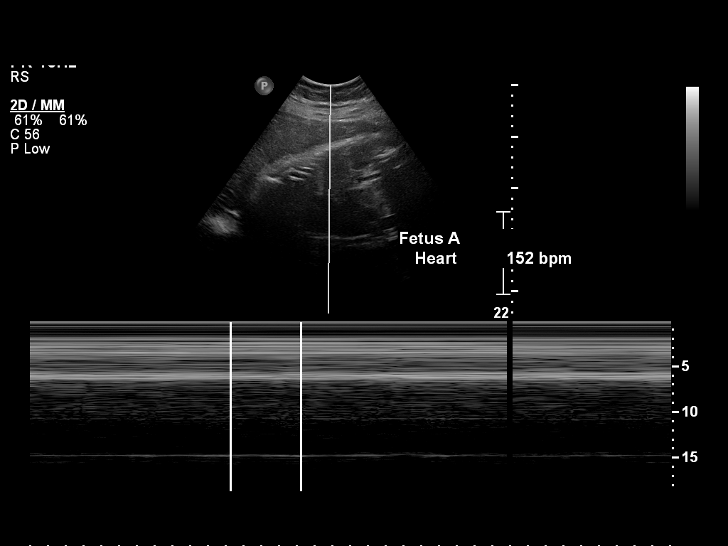
[im 6/15]
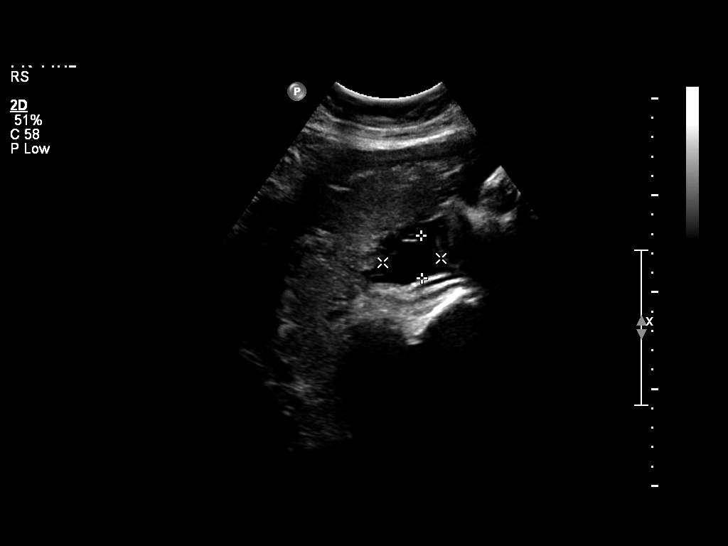
[im 7/15]
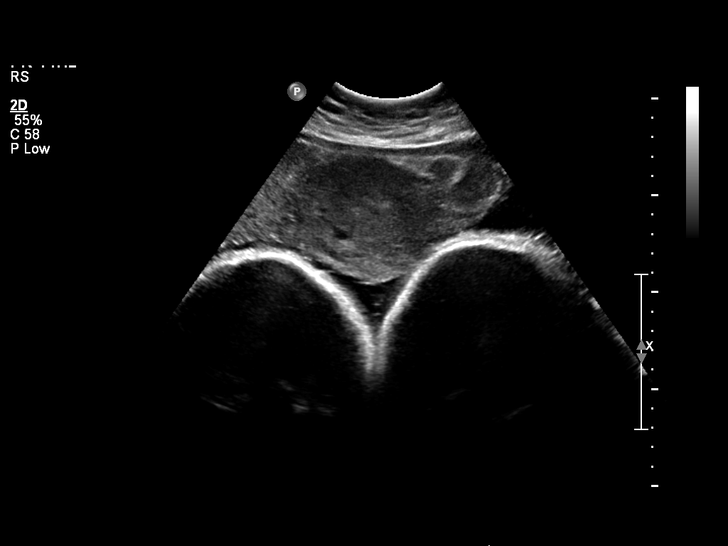
[im 8/15]
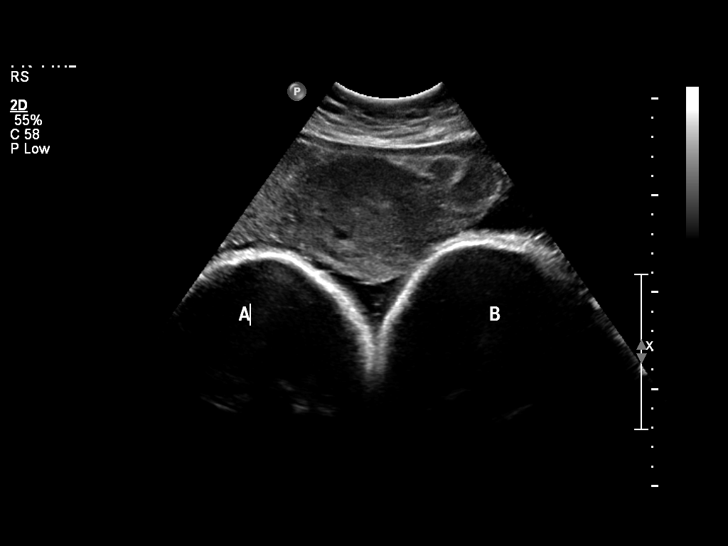
[im 9/15]
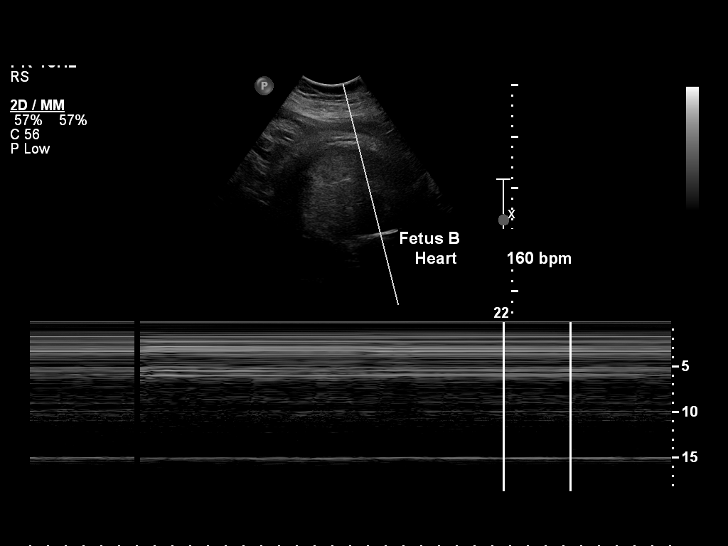
[im 10/15]
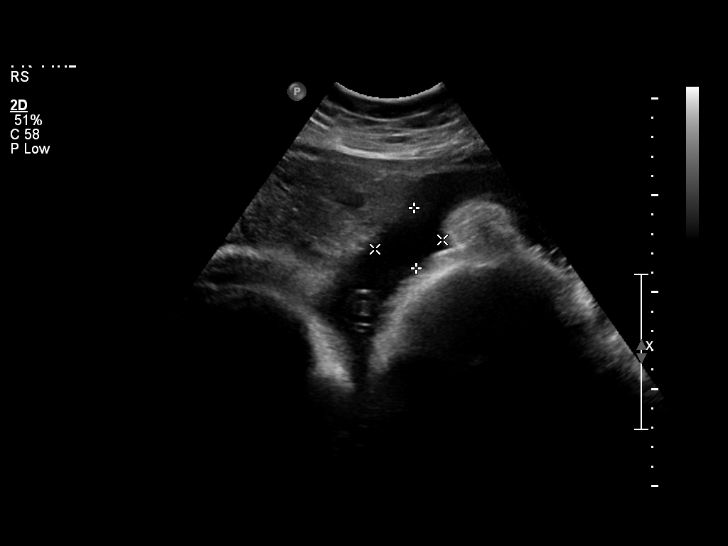
[im 11/15]
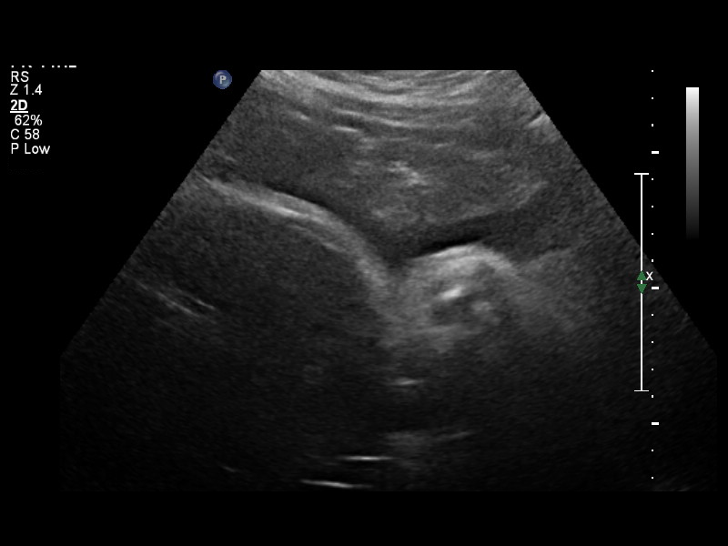
[im 13/15]
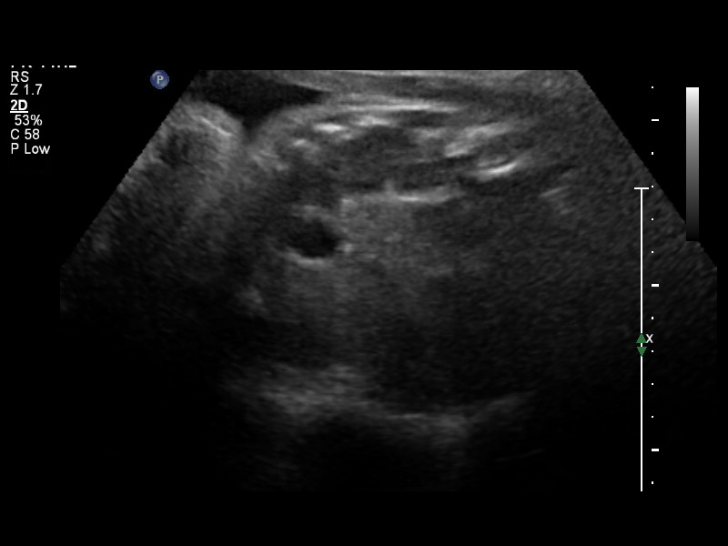
[im 14/15]
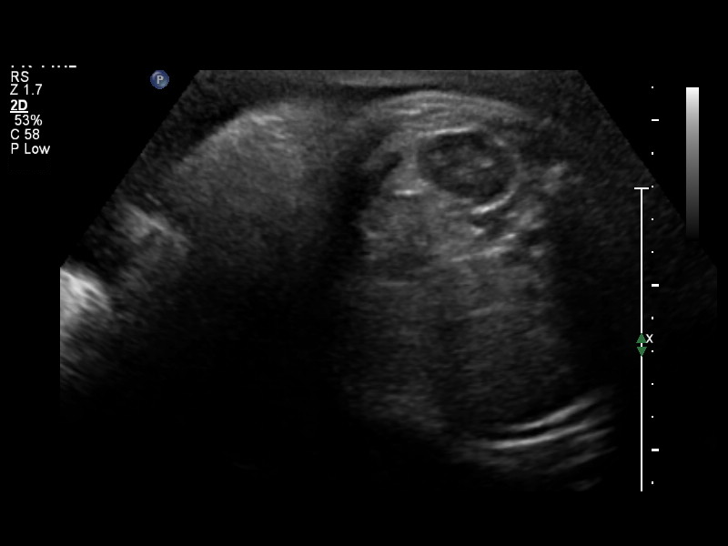
[im 15/15]
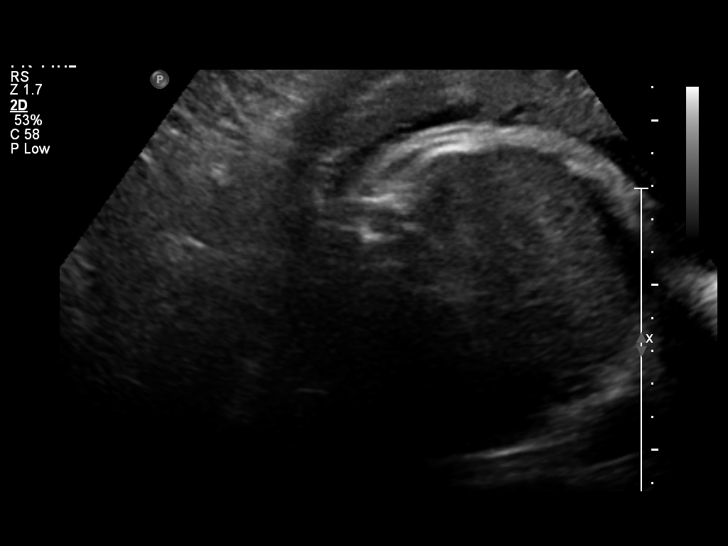

[13 of 15 positions shown; findings below may reference images not displayed]

OBSTETRICS REPORT
                      (Signed Final 01/23/2011 [DATE])

 Order#:         [id number]
Procedures

 BPP ADD'L GEST
Indications

 No or Little Prenatal Care
 Obesity - Morbid 319lbs
 Twin gestation
 Advanced maternal age (AMA), Multigravida
 Assess fetal well being
 Cigarette smoker
Fetal Evaluation (Fetus A)

 Fetal Heart Rate:  152                          bpm
 Cardiac Activity:  Observed
 Fetal Lie:         Maternal right side
 Presentation:      Breech
 Placenta:          Anterior, above cervical os

 Amniotic Fluid
 AFI FV:      Subjectively within normal limits
Biophysical Evaluation (Fetus A)

 Amniotic F.V:   Within normal limits       F. Tone:        Observed
 F. Movement:    Observed                   Score:          [DATE]
 F. Breathing:   Not Observed
Gestational Age (Fetus A)

 LMP:           36w 2d        Date:  05/14/10                 EDD:   02/18/11
 Best:          36w 2d     Det. By:  LMP  (05/14/10)          EDD:   02/18/11

Fetal Evaluation (Fetus B)

 Fetal Heart Rate:  160                          bpm
 Cardiac Activity:  Observed
 Fetal Lie:         Maternal left side
 Presentation:      Breech
 Placenta:          Anterior, above cervical os
 Amniotic Fluid
 AFI FV:      Subjectively within normal limits
Biophysical Evaluation (Fetus B)

 Amniotic F.V:   Within normal limits       F. Tone:        Observed
 F. Movement:    Observed                   Score:          [DATE]
 F. Breathing:   Observed
Gestational Age (Fetus B)

 LMP:           36w 2d        Date:  05/14/10                 EDD:   02/18/11
 Best:          36w 2d     Det. By:  LMP  (05/14/10)          EDD:   02/18/11
Impression

 Living twin IUP.
 Biophysical profile scores for twin A: [DATE], twin B: [DATE].

 questions or concerns.

## 2011-08-15 ENCOUNTER — Encounter (HOSPITAL_COMMUNITY)
Admission: RE | Admit: 2011-08-15 | Discharge: 2011-08-15 | Disposition: A | Discharge status: Home / Self Care | Payer: Medicaid Other | Source: Ambulatory Visit | Attending: Obstetrics and Gynecology | Admitting: Obstetrics and Gynecology

## 2011-08-15 DIAGNOSIS — O923 Agalactia: Secondary | ICD-10-CM | POA: Insufficient documentation

## 2011-08-15 IMAGING — US US FETAL BPP W/O NONSTRESS
1 series · 13 of 15 positions shown · non-contrast
Comparison: none

[Series 1: us fetal bpp w/o nonstress · non-contrast · 15 acquisitions, 13 frames shown]
[im 1/15]
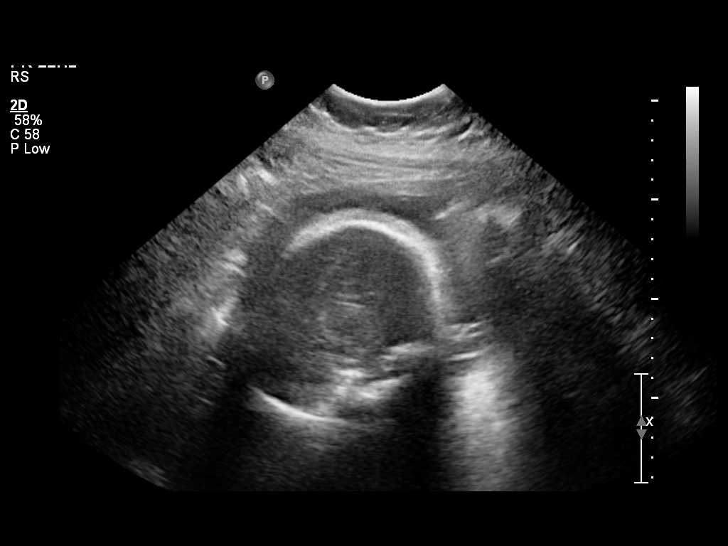
[im 2/15]
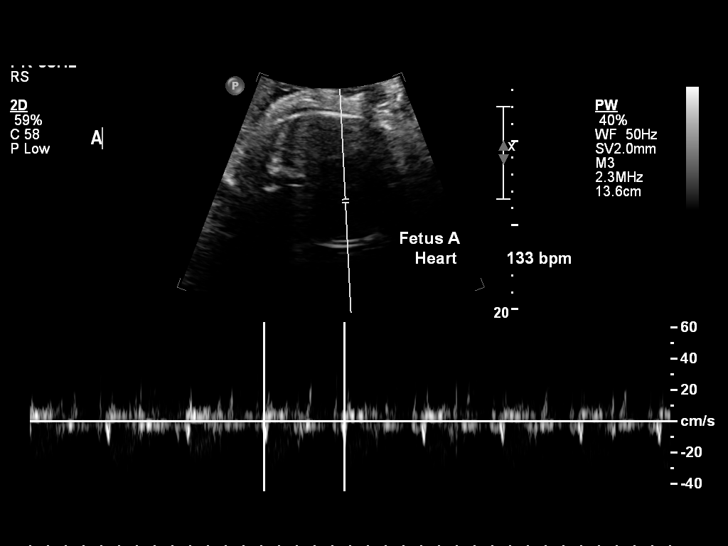
[im 3/15]
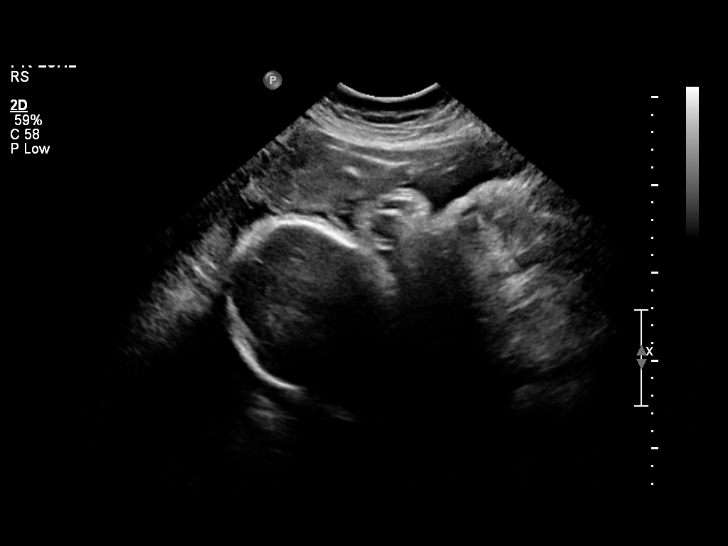
[im 5/15]
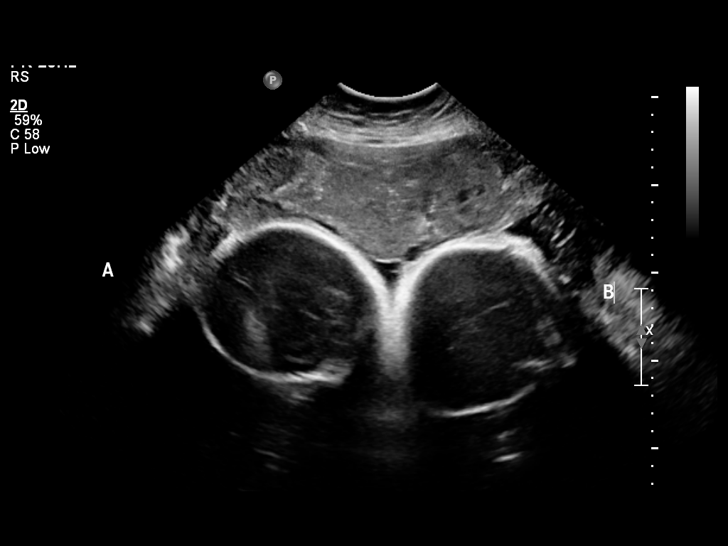
[im 6/15]
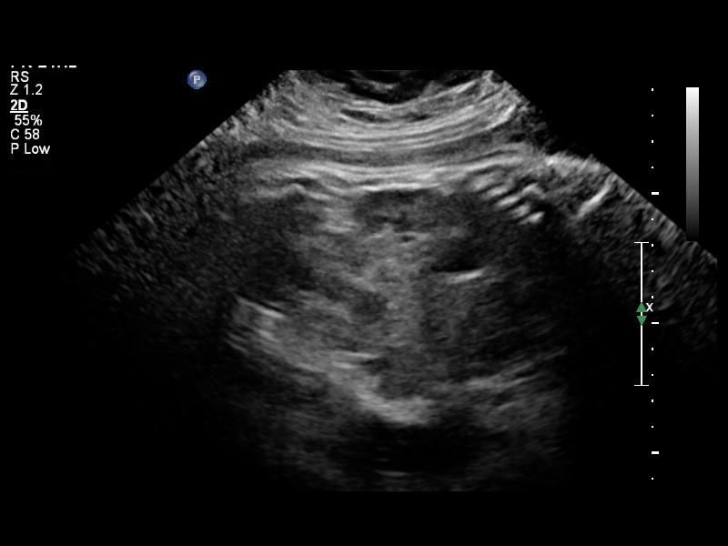
[im 7/15]
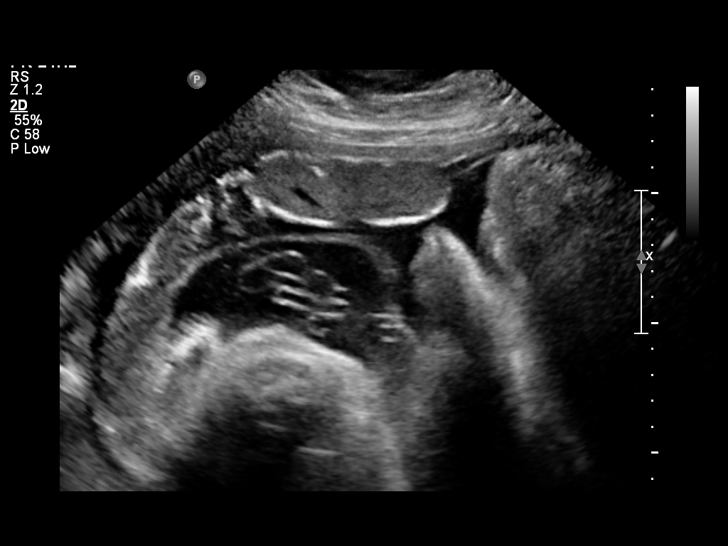
[im 8/15]
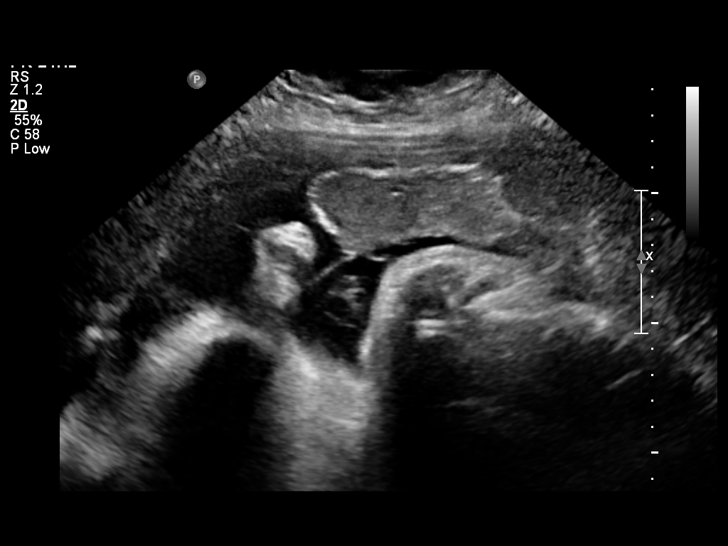
[im 9/15]
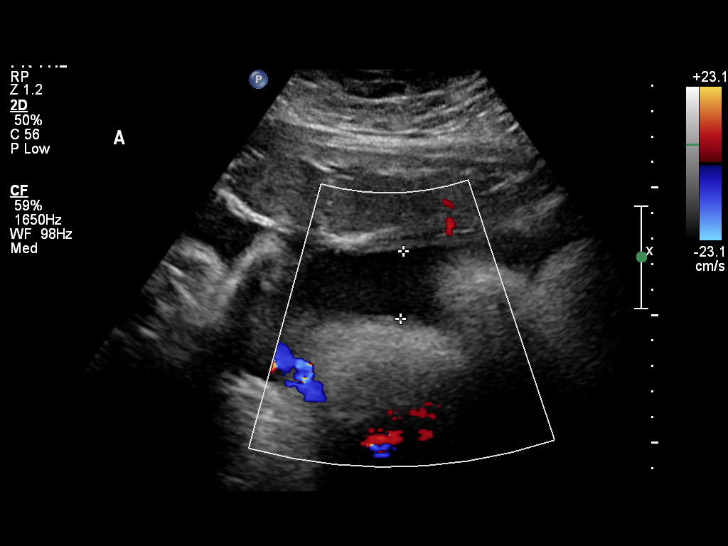
[im 10/15]
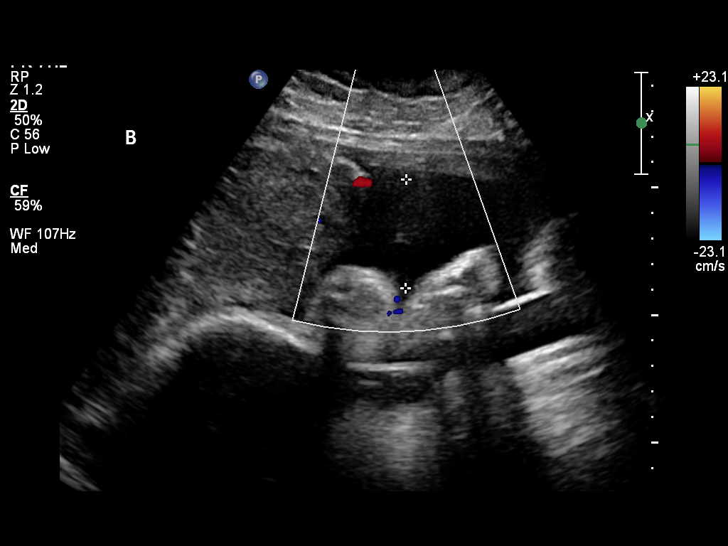
[im 11/15]
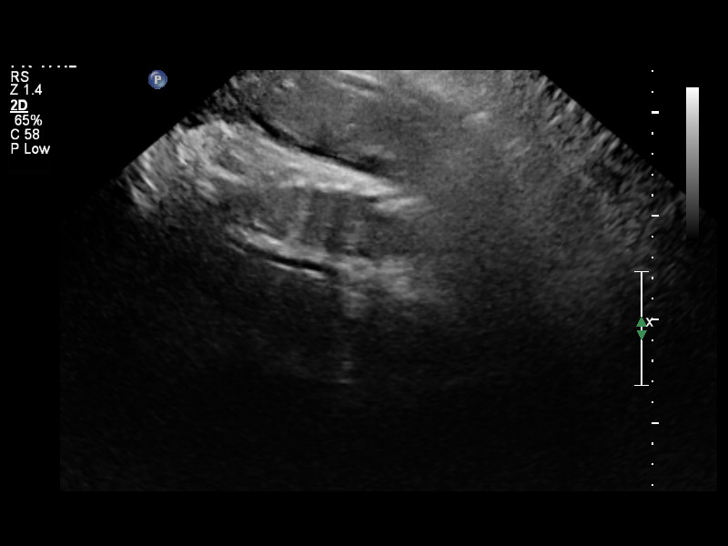
[im 13/15]
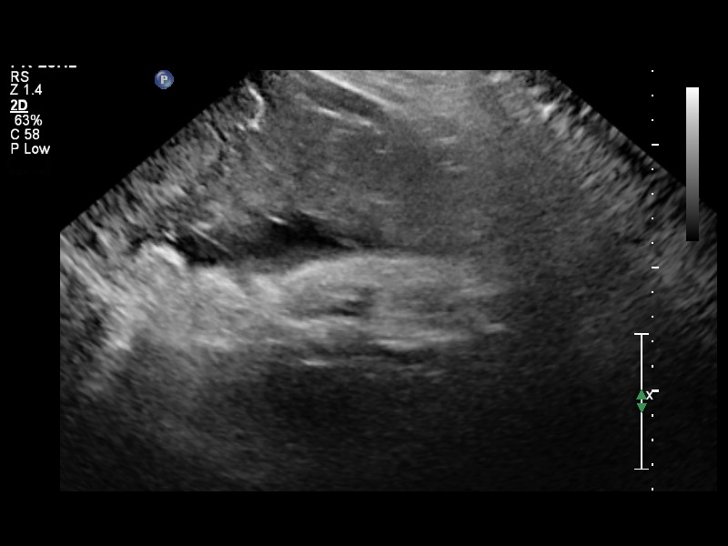
[im 14/15]
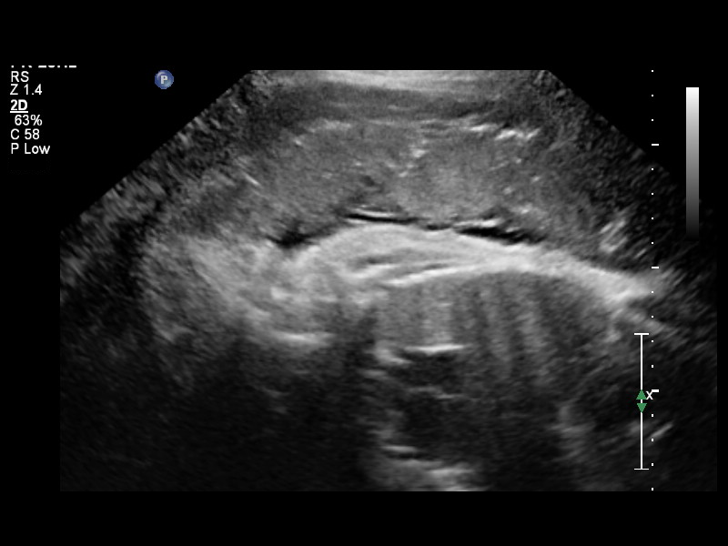
[im 15/15]
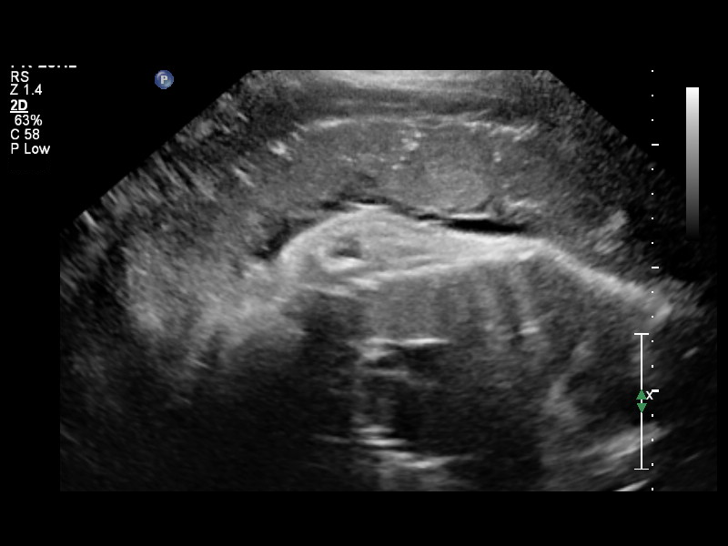

[13 of 15 positions shown; findings below may reference images not displayed]

OBSTETRICS REPORT
                      (Signed Final 01/26/2011 [DATE])

 Order#:         [id number]
                 02_O
Procedures

 US FETAL BPP ADD'L GEST                               76819.2
Indications

 No or Little Prenatal Care
 Obesity - Morbid 620lbs
 Twin gestation
 Advanced maternal age (AMA), Multigravida
 Cigarette smoker
 Assess fetal well being
Fetal Evaluation (Fetus A)

 Cardiac Activity:  Observed
 Fetal Lie:         Maternal right side
 Presentation:      Breech
 Placenta:          Anterior, above cervical os

 Membrane Desc:     Dividing
                    Membrane seen

 Amniotic Fluid
 AFI FV:      Subjectively within normal limits
                                             Larg Pckt:    2.64  cm
Biophysical Evaluation (Fetus A)

 Amniotic F.V:   Pocket => 2 cm two         F. Tone:        Observed
                 planes
 F. Movement:    Observed                   Score:          [DATE]
 F. Breathing:   Not Observed
Gestational Age (Fetus A)

 LMP:           36w 5d        Date:  05/14/10                 EDD:   02/18/11
 Best:          36w 5d     Det. By:  LMP  (05/14/10)          EDD:   02/18/11
Fetal Evaluation (Fetus B)
 Cardiac Activity:  Observed
 Fetal Lie:         Maternal left side
 Presentation:      Breech Oblique
 Placenta:          Anterior, above cervical os

 Membrane Desc:     Dividing
                    Membrane seen

 Amniotic Fluid
 AFI FV:      Subjectively within normal limits
                                             Larg Pckt:     4.3  cm
Biophysical Evaluation (Fetus B)

 Amniotic F.V:   Pocket => 2 cm two         F. Tone:        Observed
                 planes
 F. Movement:    Observed                   Score:          [DATE]
 F. Breathing:   Observed
Gestational Age (Fetus B)

 LMP:           36w 5d        Date:  05/14/10                 EDD:   02/18/11
 Best:          36w 5d     Det. By:  LMP  (05/14/10)          EDD:   02/18/11
Impression

 BPP [DATE] with zero scored for lack of sustained fetal breathing
 for twin A

 BPP [DATE] for twin B.

 Subjectively normal amniotic fluid volume with a single pocket
 measurement of > 2 x 2 cm noted associated with both twins.

 Current breech presentation twin A .

 questions or concerns.

## 2011-09-01 IMAGING — CR DG CHEST 1V PORT
1 series · 1 of 1 positions shown · non-contrast
Comparison: None

CLINICAL DATA: Status post C-section.  Chest pain.  Lower extremity
edema.

PORTABLE CHEST - 1 VIEW

[view not recorded]
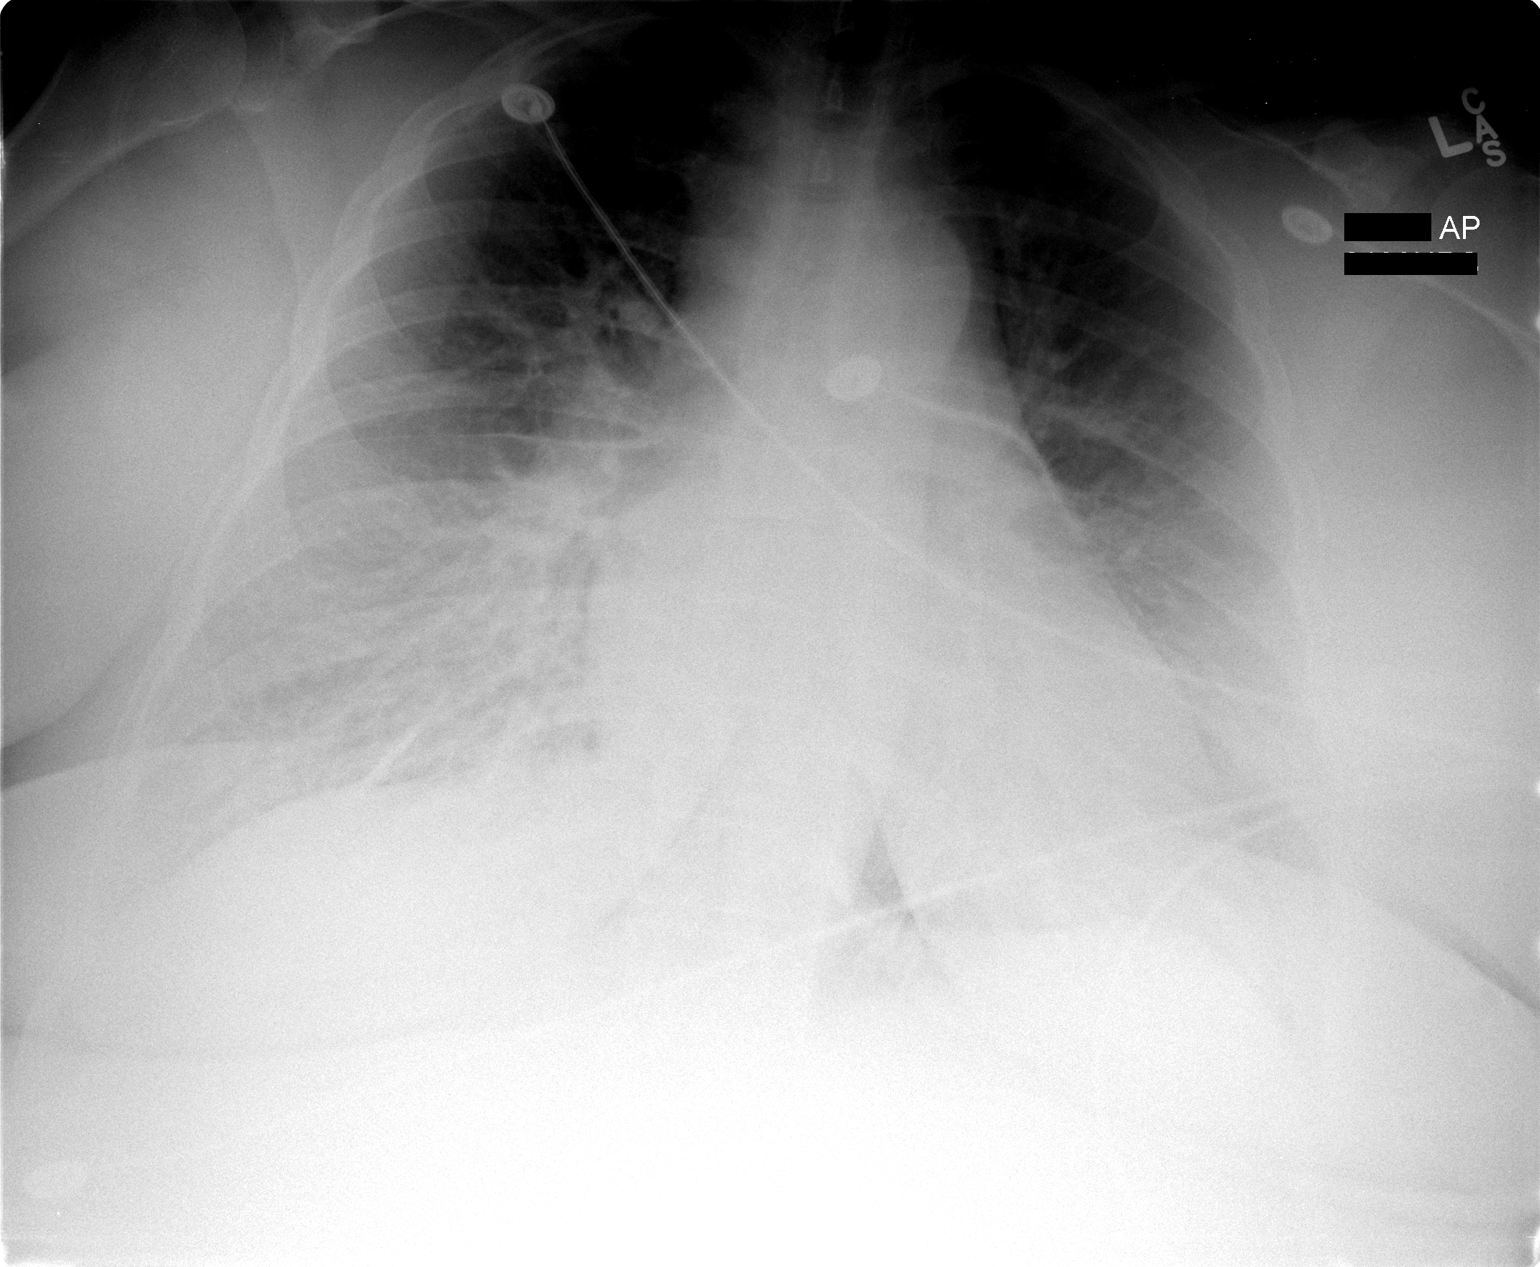

[1 of 1 positions shown; findings below may reference images not displayed]

FINDINGS: Heart is enlarged versus body habitus/position and
technique.  There is prominence of interstitial markings at the
bases, favoring interstitial edema.  Evaluation of the left lung
base is limited based on patient body habitus and technique.
Consider PA and lateral chest x-ray in the department.
IMPRESSION: Favor interstitial pulmonary edema.  Cannot entirely exclude
asymmetric involvement of the lung bases.  Consider follow-up PA
and lateral chest x-ray.

The findings were discussed with Dr. Ceola on 02/12/2011 at [DATE]
a.m.

## 2011-09-15 ENCOUNTER — Encounter (HOSPITAL_COMMUNITY)
Admission: RE | Admit: 2011-09-15 | Discharge: 2011-09-15 | Disposition: A | Discharge status: Home / Self Care | Payer: Medicaid Other | Source: Ambulatory Visit | Attending: Obstetrics and Gynecology | Admitting: Obstetrics and Gynecology

## 2011-09-15 DIAGNOSIS — O923 Agalactia: Secondary | ICD-10-CM | POA: Insufficient documentation

## 2011-10-16 ENCOUNTER — Encounter (HOSPITAL_COMMUNITY)
Admission: RE | Admit: 2011-10-16 | Discharge: 2011-10-16 | Disposition: A | Discharge status: Home / Self Care | Payer: Self-pay | Source: Ambulatory Visit | Attending: Obstetrics and Gynecology | Admitting: Obstetrics and Gynecology

## 2011-10-16 DIAGNOSIS — O923 Agalactia: Secondary | ICD-10-CM | POA: Insufficient documentation

## 2011-11-16 ENCOUNTER — Encounter (HOSPITAL_COMMUNITY)
Admission: RE | Admit: 2011-11-16 | Discharge: 2011-11-16 | Disposition: A | Discharge status: Home / Self Care | Payer: Medicaid Other | Source: Ambulatory Visit | Attending: Obstetrics and Gynecology | Admitting: Obstetrics and Gynecology

## 2011-11-16 DIAGNOSIS — O923 Agalactia: Secondary | ICD-10-CM | POA: Insufficient documentation

## 2011-12-17 ENCOUNTER — Encounter (HOSPITAL_COMMUNITY)
Admission: RE | Admit: 2011-12-17 | Discharge: 2011-12-17 | Disposition: A | Discharge status: Home / Self Care | Payer: Self-pay | Source: Ambulatory Visit | Attending: Obstetrics and Gynecology | Admitting: Obstetrics and Gynecology

## 2011-12-17 DIAGNOSIS — O923 Agalactia: Secondary | ICD-10-CM | POA: Insufficient documentation

## 2012-01-17 ENCOUNTER — Encounter (HOSPITAL_COMMUNITY)
Admission: RE | Admit: 2012-01-17 | Discharge: 2012-01-17 | Disposition: A | Discharge status: Home / Self Care | Payer: Medicaid Other | Source: Ambulatory Visit | Attending: Obstetrics and Gynecology | Admitting: Obstetrics and Gynecology

## 2012-01-17 DIAGNOSIS — O923 Agalactia: Secondary | ICD-10-CM | POA: Insufficient documentation

## 2012-02-16 ENCOUNTER — Encounter (HOSPITAL_COMMUNITY)
Admission: RE | Admit: 2012-02-16 | Discharge: 2012-02-16 | Disposition: A | Discharge status: Home / Self Care | Payer: Medicaid Other | Source: Ambulatory Visit | Attending: Obstetrics and Gynecology | Admitting: Obstetrics and Gynecology

## 2012-02-16 DIAGNOSIS — O923 Agalactia: Secondary | ICD-10-CM | POA: Insufficient documentation

## 2014-09-21 ENCOUNTER — Encounter (HOSPITAL_COMMUNITY): Payer: Self-pay

## 2022-05-10 ENCOUNTER — Encounter: Payer: Self-pay | Admitting: Family Medicine

## 2022-05-10 ENCOUNTER — Other Ambulatory Visit (HOSPITAL_COMMUNITY)
Admission: RE | Admit: 2022-05-10 | Discharge: 2022-05-10 | Disposition: A | Discharge status: Home / Self Care | Payer: 59 | Source: Ambulatory Visit | Attending: Family Medicine | Admitting: Family Medicine

## 2022-05-10 ENCOUNTER — Ambulatory Visit (INDEPENDENT_AMBULATORY_CARE_PROVIDER_SITE_OTHER): Payer: 59 | Admitting: Family Medicine

## 2022-05-10 VITALS — BP 157/90 | HR 84 | Ht 64.0 in | Wt 308.0 lb

## 2022-05-10 DIAGNOSIS — Z30431 Encounter for routine checking of intrauterine contraceptive device: Secondary | ICD-10-CM

## 2022-05-10 DIAGNOSIS — Z124 Encounter for screening for malignant neoplasm of cervix: Secondary | ICD-10-CM

## 2022-05-10 DIAGNOSIS — F3341 Major depressive disorder, recurrent, in partial remission: Secondary | ICD-10-CM

## 2022-05-10 DIAGNOSIS — L409 Psoriasis, unspecified: Secondary | ICD-10-CM | POA: Diagnosis not present

## 2022-05-10 DIAGNOSIS — Z01419 Encounter for gynecological examination (general) (routine) without abnormal findings: Secondary | ICD-10-CM | POA: Diagnosis not present

## 2022-05-10 DIAGNOSIS — Z23 Encounter for immunization: Secondary | ICD-10-CM

## 2022-05-10 DIAGNOSIS — F329 Major depressive disorder, single episode, unspecified: Secondary | ICD-10-CM | POA: Insufficient documentation

## 2022-05-10 DIAGNOSIS — N393 Stress incontinence (female) (male): Secondary | ICD-10-CM | POA: Insufficient documentation

## 2022-05-10 DIAGNOSIS — Z1231 Encounter for screening mammogram for malignant neoplasm of breast: Secondary | ICD-10-CM

## 2022-05-10 HISTORY — DX: Stress incontinence (female) (male): N39.3

## 2022-05-10 NOTE — Progress Notes (Signed)
Has a mirena in 2012, did have a bit spotting a few months ago

## 2022-05-10 NOTE — Progress Notes (Signed)
Subjective:     [name] is a 51 y.o. female and is here for a comprehensive physical exam. The patient reports no problems. Has not seen MD since 2012. Has Mirena in since then. She does not want to remove it. Has little to no cycles. Sometimes has some increasing spotting. No hot flashes. Has h/o PP fluid overload, but was told it was surgical only. Denies CP, SOB. She reports some knee pain on the left. Alos has some urinary incontinence with sneezing, coughing, laughing.   The following portions of the patient's history were reviewed and updated as appropriate: allergies, current medications, past family history, past medical history, past social history, past surgical history, and problem list.  Review of Systems Pertinent items noted in HPI and remainder of comprehensive ROS otherwise negative.   Objective:    BP (!) 157/90   Pulse 84   Ht 5\' 4"  (1.626 m)   Wt (!) 308 lb (139.7 kg)   BMI 52.87 kg/m  General appearance: alert, cooperative, appears stated age, and morbidly obese Head: Normocephalic, without obvious abnormality, atraumatic Neck: no adenopathy, supple, symmetrical, trachea midline, and thyroid not enlarged, symmetric, no tenderness/mass/nodules Lungs: clear to auscultation bilaterally Breasts: normal appearance, no masses or tenderness Heart: regular rate and rhythm, S1, S2 normal, no murmur, click, rub or gallop Abdomen: soft, non-tender; bowel sounds normal; no masses,  no organomegaly Pelvic: cervix normal in appearance, external genitalia normal, no adnexal masses or tenderness, no cervical motion tenderness, uterus normal size, shape, and consistency, vagina normal without discharge, and IUD strings noted Extremities: Homans sign is negative, no sign of DVT Pulses: 2+ and symmetric Skin: Skin color, texture, turgor normal. No rashes or lesions Lymph nodes: Cervical, supraclavicular, and axillary nodes normal. Neurologic: Grossly normal    Assessment:     GYN female exam.      Plan:   Problem List Items Addressed This Visit       Unprioritized   Psoriasis   Depression, major   Relevant Medications   buPROPion (WELLBUTRIN XL) 300 MG 24 hr tablet   Stress incontinence    Declined referral to UroGYN--trial of Kegel's, Thinx      Morbid obesity (HCC)   Other Visit Diagnoses     Screening for malignant neoplasm of cervix    -  Primary   Relevant Orders   Cytology - PAP   Encounter for gynecological examination without abnormal finding       Encounter for screening mammogram for malignant neoplasm of breast       Relevant Orders   MM 3D SCREEN BREAST BILATERAL   Need for Tdap vaccination       Relevant Orders   Tdap vaccine greater than or equal to 7yo IM (Completed)   IUD check up       Given age, and low liklihood of pregnancy and minimal bleeding, will leave this IUD in place--can replace if desires.      Return in 1 year (on 05/11/2023).    See After Visit Summary for Counseling Recommendations

## 2022-05-10 NOTE — Assessment & Plan Note (Signed)
Declined referral to UroGYN--trial of Kegel's, Thinx

## 2022-05-15 LAB — CYTOLOGY - PAP
Comment: NEGATIVE
Diagnosis: NEGATIVE
High risk HPV: NEGATIVE

## 2023-10-24 ENCOUNTER — Ambulatory Visit (INDEPENDENT_AMBULATORY_CARE_PROVIDER_SITE_OTHER): Payer: Managed Care, Other (non HMO) | Admitting: General Practice

## 2023-10-24 ENCOUNTER — Encounter: Payer: Self-pay | Admitting: General Practice

## 2023-10-24 VITALS — BP 142/92 | HR 91 | Temp 97.5°F | Ht 64.85 in | Wt 352.0 lb

## 2023-10-24 DIAGNOSIS — M25562 Pain in left knee: Secondary | ICD-10-CM | POA: Diagnosis not present

## 2023-10-24 DIAGNOSIS — Z6841 Body Mass Index (BMI) 40.0 and over, adult: Secondary | ICD-10-CM

## 2023-10-24 DIAGNOSIS — F329 Major depressive disorder, single episode, unspecified: Secondary | ICD-10-CM

## 2023-10-24 DIAGNOSIS — E66813 Obesity, class 3: Secondary | ICD-10-CM | POA: Diagnosis not present

## 2023-10-24 DIAGNOSIS — F3341 Major depressive disorder, recurrent, in partial remission: Secondary | ICD-10-CM

## 2023-10-24 DIAGNOSIS — Z23 Encounter for immunization: Secondary | ICD-10-CM | POA: Diagnosis not present

## 2023-10-24 DIAGNOSIS — G8929 Other chronic pain: Secondary | ICD-10-CM

## 2023-10-24 DIAGNOSIS — M1712 Unilateral primary osteoarthritis, left knee: Secondary | ICD-10-CM | POA: Insufficient documentation

## 2023-10-24 DIAGNOSIS — Z7689 Persons encountering health services in other specified circumstances: Secondary | ICD-10-CM | POA: Insufficient documentation

## 2023-10-24 MED ORDER — NAPROXEN 500 MG PO TABS: 500.0000 mg | ORAL_TABLET | Freq: Two times a day (BID) | ORAL | 0 refills | Status: DC

## 2023-10-24 MED ORDER — BUPROPION HCL ER (XL) 300 MG PO TB24: 300.0000 mg | ORAL_TABLET | Freq: Every day | ORAL | 0 refills | Status: DC

## 2023-10-24 NOTE — Assessment & Plan Note (Addendum)
Uncontrolled. Full ROM and equal strength on physical exam. Pain is with activity.   Failed many over the counter treatment. Has tried heat, elevation, ice, compression.   Start Naproxen 500 BID as needed for pain.   Schedule appointment with Dr. Patsy Lager, orthopedics. She is open to trying physical therapy but would like to see the ortho first.

## 2023-10-24 NOTE — Assessment & Plan Note (Addendum)
Briefly reviewed mychart records.   Immunizations- shingrix 1st dose given today. Declined flu vaccine and covid booster. Tetanus- up to date.   Mammogram- declined.   Colonoscopy- declined.   Pap smear- up to date.
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
## 2023-10-24 NOTE — Assessment & Plan Note (Signed)
Controlled.   Continue Bupropion ER 300 mg once daily. Rx sent.   Follow up with 3 months.

## 2023-10-24 NOTE — Patient Instructions (Addendum)
Schedule office visit with Dr. Patsy Lager for knee pain.   Referral for healthy weight and wellness has been placed and you will get a phone call or a message on my chart.   Refill sent for Wellbutrin.   Follow up in three months.   It was a pleasure meeting you!

## 2023-10-24 NOTE — Assessment & Plan Note (Signed)
>>  ASSESSMENT AND PLAN FOR CHRONIC PAIN OF LEFT KNEE WRITTEN ON 10/24/2023  6:08 PM BY Modesto Charon, NP  Uncontrolled. Full ROM and equal strength on physical exam. Pain is with activity.   Failed many over the counter treatment. Has tried heat, elevation, ice, compression.   Start Naproxen 500 BID as needed for pain.   Schedule appointment with Dr. Patsy Lager, orthopedics. She is open to trying physical therapy but would like to see the ortho first.

## 2023-10-24 NOTE — Assessment & Plan Note (Signed)
Uncontrolled.   Failed multiple diet and exercise plans.   Discussed with patient and her husband at length about different options regarding medication, bariatric surgery and healthy weight and wellness.   Patient would like to try the healthy weight and wellness. Referral placed.   Follow up in 3 months.

## 2023-10-28 NOTE — Progress Notes (Unsigned)
    Megan Hendriks T. Badr Piedra, MD, CAQ Sports Medicine [facility] [address] [place], [address]  Phone: [phone]  FAX: [phone]  [name] - 52 y.o. female  MRN [id number]  Date of Birth: Sep 03, 1971  Date: 10/29/2023  PCP: Modesto Charon, NP  Referral: Modesto Charon, NP  No chief complaint on file.  Subjective:   [name] is a 52 y.o. very pleasant female patient with There is no height or weight on file to calculate BMI. who presents with the following:  [name] is a very pleasant lady who just established care with Mrs. [name], and she has been plagued with some ongoing chronic left knee pain.    Review of Systems is noted in the HPI, as appropriate  Objective:   There were no vitals taken for this visit.  GEN: No acute distress; alert,appropriate. PULM: Breathing comfortably in no respiratory distress PSYCH: Normally interactive.   Laboratory and Imaging Data:  Assessment and Plan:   ***

## 2023-10-29 ENCOUNTER — Ambulatory Visit (INDEPENDENT_AMBULATORY_CARE_PROVIDER_SITE_OTHER): Payer: Managed Care, Other (non HMO) | Admitting: Family Medicine

## 2023-10-29 ENCOUNTER — Encounter: Payer: Self-pay | Admitting: Family Medicine

## 2023-10-29 ENCOUNTER — Ambulatory Visit (INDEPENDENT_AMBULATORY_CARE_PROVIDER_SITE_OTHER)
Admission: RE | Admit: 2023-10-29 | Discharge: 2023-10-29 | Disposition: A | Discharge status: Home / Self Care | Payer: Managed Care, Other (non HMO) | Source: Ambulatory Visit | Attending: Family Medicine | Admitting: Family Medicine

## 2023-10-29 VITALS — BP 150/98 | HR 107 | Temp 97.3°F | Ht 64.85 in | Wt 355.1 lb

## 2023-10-29 DIAGNOSIS — G8929 Other chronic pain: Secondary | ICD-10-CM

## 2023-10-29 DIAGNOSIS — M25562 Pain in left knee: Secondary | ICD-10-CM | POA: Diagnosis not present

## 2023-10-29 DIAGNOSIS — M1712 Unilateral primary osteoarthritis, left knee: Secondary | ICD-10-CM | POA: Diagnosis not present

## 2023-10-29 DIAGNOSIS — Z6841 Body Mass Index (BMI) 40.0 and over, adult: Secondary | ICD-10-CM

## 2023-10-29 MED ORDER — WEGOVY 0.25 MG/0.5ML ~~LOC~~ SOAJ: 0.2500 mg | SUBCUTANEOUS | 0 refills | Status: DC

## 2023-10-29 MED ORDER — CELECOXIB 200 MG PO CAPS: 200.0000 mg | ORAL_CAPSULE | Freq: Every day | ORAL | 2 refills | Status: DC

## 2023-10-29 MED ORDER — TRIAMCINOLONE ACETONIDE 40 MG/ML IJ SUSP
40.0000 mg | Freq: Once | INTRAMUSCULAR | Status: AC
  Administered 2023-10-29: 40 mg via INTRA_ARTICULAR

## 2023-12-17 ENCOUNTER — Other Ambulatory Visit (HOSPITAL_COMMUNITY): Payer: Self-pay

## 2024-01-02 ENCOUNTER — Encounter: Payer: BC Managed Care – PPO | Attending: General Practice | Admitting: Dietician

## 2024-01-02 ENCOUNTER — Encounter: Payer: Self-pay | Admitting: Dietician

## 2024-01-02 DIAGNOSIS — E66813 Obesity, class 3: Secondary | ICD-10-CM | POA: Diagnosis not present

## 2024-01-02 DIAGNOSIS — Z6841 Body Mass Index (BMI) 40.0 and over, adult: Secondary | ICD-10-CM

## 2024-01-02 NOTE — Patient Instructions (Addendum)
Begin to prioritize LEAN PROTEINS at each of your meals. Use your protein list for alternative options of your high fay proteins!  Continue to recognize your energy dense food choices moving forward and look for alternative choices.  Look for options like "Low or Reduced Fat", "Low Sodium" "No Salt added", "Low Carb", or "High fiber" on your packaged and processed foods.  If experiencing constipation, have a daily Benefiber or Metamucil fiber supplement or Miralax!

## 2024-01-02 NOTE — Progress Notes (Signed)
Medical Nutrition Therapy  Appointment Start time:  1120  Appointment End time:  1235  Primary concerns today: Weight Loss  Referral diagnosis: E66.01 - Obesity Preferred learning style: No preference indicated Learning readiness: Not ready   NUTRITION ASSESSMENT   Clinical Medical Hx: Obesity, MDD Medications: Wellbutrin, Celebrex Labs: N/A Notable Signs/Symptoms: N/A  Lifestyle & Dietary Hx Pt reports being prescribed WeGovy for weight loss, but insurance won't cover it without proof that they are unable to lose weight through diet and lifestlye changes. Pt reports history of short term adherence to changes, but reverts back to previous habits. Pt reports trying "starvation" diet, liquid diet, Atkins, Keto, Carnivore diets, 1,500 calorie, Low carb approaches for weight loss in the past. Pt reports never eating breakfast because food makes them nauseous in the morning, first meal usually at lunch, and will graze on snacks (carbs) at home, states bread and potatoes. Pt reports enjoying vegetables (greens, broccoli, cabbage, asparagus). Pt reports goal of weight loss to be able to exercise without pain, reports severe arthritis in L knee, and moderate in R knee. Pt reports previously spending ~2 hours in the gym resistance training for 7 days a week about 6 years ago, states they were able to do this for ~1.5 years, lost 120 lbs, but injured knee and eventually gained all that weight back plus some. 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 Pt reports tending to animals and their land helps to de-stress, but also finds themselves emotionally eating.  Pt reports having 2 teenage kids at home and that their husband is enabling at times, reports constant "food noise" when at home in the evening.    Estimated daily fluid intake: 48 oz Supplements: Women's MVI, Magnesium Sleep: Fine Stress / self-care: 8/10, High stress job Current average  weekly physical activity: Sedentary   24-Hr Dietary Recall First Meal: Oatmeal and raisin cookie, coffee (sugar-free creamer) Snack: None Second Meal: 2 bratwurst, sauerkraut, no buns Snack: Chocolate chip cookie Third Meal: 4 slices Digiorno 3 meat pizza, Drumstick ice cream, Snack: yogurt covered pretzels Beverages: Dr. Marisue Ivan, Water, Coffee    NUTRITION DIAGNOSIS  NB-1.1 Food and nutrition-related knowledge deficit As related to obesity.  As evidenced by BMI >50 kg/m2, dietary history very high in energy dense, hyperpalatable foods.   NUTRITION INTERVENTION  Nutrition education (E-1) on the following topics:  Educated patient on the two components of energy balance: Energy in (calories), and energy out (activity). Explain the role of negative energy balance in weight loss. Discussed options with patient to achieve a negative energy balance and how to best control energy in and energy out to accommodate their lifestyle. Educated patient on the balanced plate eating model. Recommended lunch and dinner be 1/2 non-starchy vegetables, 1/4 starches, and 1/4 protein. Discussed with patient the importance of working towards hitting the proportions of the balanced plate consistently. Counseled patient on ways to begin recognizing each of the food groups from the balanced plate in their own meals, and how close they are to fitting the recommended proportions of the balanced plate. Educated patient on the nutritional value of each food group on the balanced plate model.  Counseled patient on beginning to rebuild their trust in themselves to make the right food choices for their health. Educated patient on mindful eating, including listening to their body's hunger and satiety cues, as well as eating slowly and allowing meals to be more of a sensory experience. Counseled patient  on allowing themselves to be present in their emotions when they consider emotional eating. Advised patient to evaluate  whether the impulse to eat is hunger based, or emotionally driven.  Handouts Provided Include  Protein List  Learning Style & Readiness for Change Teaching method utilized: Visual & Auditory  Demonstrated degree of understanding via: Teach Back  Barriers to learning/adherence to lifestyle change: Denial/Resistance to change  Goals Established by Pt Begin to prioritize LEAN PROTEINS at each of your meals. Use your protein list for alternative options of your high fay proteins! Continue to recognize your energy dense food choices moving forward and look for alternative choices. Look for options like "Low or Reduced Fat", "Low Sodium" "No Salt added", "Low Carb", or "High fiber" on your packaged and processed foods. If experiencing constipation, have a daily Benefiber or Metamucil fiber supplement or Miralax!   MONITORING & EVALUATION Dietary intake, weekly physical activity, and weight change PRN.  Next Steps  Patient is to call to schedule follow-up PRN.

## 2024-01-22 ENCOUNTER — Telehealth: Payer: Self-pay

## 2024-01-22 ENCOUNTER — Other Ambulatory Visit (HOSPITAL_COMMUNITY): Payer: Self-pay

## 2024-01-22 ENCOUNTER — Ambulatory Visit (INDEPENDENT_AMBULATORY_CARE_PROVIDER_SITE_OTHER): Payer: Managed Care, Other (non HMO) | Admitting: General Practice

## 2024-01-22 ENCOUNTER — Encounter: Payer: Self-pay | Admitting: General Practice

## 2024-01-22 VITALS — BP 124/88 | HR 97 | Temp 98.7°F | Ht 64.85 in | Wt 367.0 lb

## 2024-01-22 DIAGNOSIS — Z6841 Body Mass Index (BMI) 40.0 and over, adult: Secondary | ICD-10-CM

## 2024-01-22 DIAGNOSIS — E66813 Obesity, class 3: Secondary | ICD-10-CM

## 2024-01-22 DIAGNOSIS — R030 Elevated blood-pressure reading, without diagnosis of hypertension: Secondary | ICD-10-CM | POA: Insufficient documentation

## 2024-01-22 DIAGNOSIS — M1712 Unilateral primary osteoarthritis, left knee: Secondary | ICD-10-CM

## 2024-01-22 DIAGNOSIS — M25562 Pain in left knee: Secondary | ICD-10-CM

## 2024-01-22 DIAGNOSIS — G8929 Other chronic pain: Secondary | ICD-10-CM

## 2024-01-22 NOTE — Telephone Encounter (Signed)
 Yes as that's what the patient had told me today but when [facility] said [facility]. We would like the [facility] as [facility] [place] [facility] notification that was needed in order to let the PA team know it was needed.

## 2024-01-22 NOTE — Telephone Encounter (Signed)
 [facility] requires PA; please start for patient.

## 2024-01-22 NOTE — Telephone Encounter (Signed)
 Per previous encounters, Reginal Lutes was discontinued due to not being covered by the patient's insurance.  Per test claim: Patient must call [employer] [phone] for coverage.

## 2024-01-22 NOTE — Patient Instructions (Addendum)
 Stop by the lab prior to leaving today. I will notify you of your results once received.   Keep BP log at home for two weeks. Please report above 140/90 or below 90/60. Please update me as soon as possible.   Schedule follow up with Dr. Patsy Lager for knee.   Follow up in two weeks for physical and BP.

## 2024-01-22 NOTE — Assessment & Plan Note (Signed)
 Uncontrolled.   Only tried Celebrex for 3 weeks with no relief.   She will schedule follow up with Dr. Patsy Lager.

## 2024-01-22 NOTE — Assessment & Plan Note (Signed)
 Uncontrolled.   P.A. resubmitted for semaglutide as it was never completed.   Discussed at length with patient the importance of adherence to exercise regimen and monitoring her diet.   Discussed using the tools provided by the nutritionist.   She has tried Phentermine in the past and we may consider that as an option if GLP-1 does not get approved.

## 2024-01-22 NOTE — Progress Notes (Signed)
 Established Patient Office Visit  Subjective   Patient ID: [name], female    DOB: 11-10-1971  Age: 53 y.o. MRN: [id number]  Chief Complaint  Patient presents with   Obesity    Reginal Lutes did not get approved and would like to discuss options    Knee Pain    Took celebrex but did not help and stopped taking it.     Knee Pain    [name] is a 53 year old female with past medical history of psoriasis, depression, chronic knee pain, obesity presents today for a follow-up.  Chronic knee pain: She was evaluated by Dr. Patsy Lager on 10/29/2023.  She had an x-ray of her left knee that day which showed severe degenerative changes of the medial compartment. She was given 40 mg of Kenalog in her left knee that day.  She was also given a prescription for Celebrex 200 mg once daily.  Today she reports, that the injection helped for four days. She has been taking her Celebrex 200 mg for three weeks but it was not helping and then she stopped it.   Obesity: On 10/29/2023 Dr. Dallas Schimke sent in a prescription for semaglutide injection to help with weight loss as her weight could be contributing to her knee pain.  She was also referred to healthy weight and wellness and had her first appointment on 01/02/2024.  Apprarently the semaglutide injection PA was never completed. Today she reports that she has been making bad eating choices. She is not exercising. She feels that maybe a appetite suppressant could help. She reports that she knows that she should make better choices but does not. She did not feel that the visit with the nutritionist was   HTN: On multiple visits in the past she was found to be hypertensive. She does not have a known history of hypertension. She denies any blurred vision, headaches, chest pain or shortness of breath. She does not check her BP at home.   Patient Active Problem List   Diagnosis Date Noted   Elevated blood pressure reading 01/22/2024   Recurrent major depressive  disorder, in partial remission (HCC) 10/24/2023   Class 3 severe obesity due to excess calories without serious comorbidity with body mass index (BMI) of 50.0 to 59.9 in adult [facility]) 10/24/2023   Primary osteoarthritis of left knee 10/24/2023   Psoriasis 05/10/2022   Depression, major 05/10/2022   Past Medical History:  Diagnosis Date   Chicken pox    Depression    Mental disorder    Stress incontinence 05/10/2022   No Known Allergies       01/22/2024   10:51 AM 01/02/2024   11:12 AM 10/24/2023    3:03 PM  Depression screen PHQ 2/9  Decreased Interest 2 0 0  Down, Depressed, Hopeless 0 0 0  PHQ - 2 Score 2 0 0  Altered sleeping 0  0  Tired, decreased energy 2  1  Change in appetite 3  1  Feeling bad or failure about yourself  3  1  Trouble concentrating 0  2  Moving slowly or fidgety/restless 0  0  Suicidal thoughts 0  0  PHQ-9 Score 10  5  Difficult doing work/chores Somewhat difficult  Somewhat difficult       01/22/2024   10:51 AM 10/24/2023    3:03 PM  GAD 7 : Generalized Anxiety Score  Nervous, Anxious, on Edge 0 0  Control/stop worrying 0 0  Worry too much - different things 0 0  Trouble relaxing 0 0  Restless 0 0  Easily annoyed or irritable 0 0  Afraid - awful might happen 0 0  Total GAD 7 Score 0 0  Anxiety Difficulty Not difficult at all Not difficult at all      Review of Systems  Constitutional:  Negative for chills and fever.  Respiratory:  Negative for shortness of breath.   Cardiovascular:  Negative for chest pain.  Gastrointestinal:  Negative for abdominal pain, constipation, diarrhea, heartburn, nausea and vomiting.  Genitourinary:  Negative for dysuria, frequency and urgency.  Neurological:  Negative for dizziness and headaches.  Endo/Heme/Allergies:  Negative for polydipsia.  Psychiatric/Behavioral:  Negative for depression and suicidal ideas. The patient is not nervous/anxious.       Objective:     BP 124/88 (BP Location: Left Arm,  Patient Position: Sitting, Cuff Size: Large)   Pulse 97   Temp 98.7 F (37.1 C) (Oral)   Ht 5' 4.85" (1.647 m)   Wt (!) 367 lb (166.5 kg)   SpO2 98%   BMI 61.35 kg/m  BP Readings from Last 3 Encounters:  01/22/24 124/88  10/29/23 (!) 150/98  10/24/23 (!) 142/92   Wt Readings from Last 3 Encounters:  01/22/24 (!) 367 lb (166.5 kg)  10/29/23 (!) 355 lb 2 oz (161.1 kg)  10/24/23 (!) 352 lb (159.7 kg)      Physical Exam   No results found for any visits on 01/22/24.     The 10-year ASCVD risk score (Arnett DK, et al., 2019) is: 1.8%    Assessment & Plan:  Elevated blood pressure reading Assessment & Plan: BP at goal today.   Discussed checking BP at home for two weeks.   Given parameters for consistent readings to report back sooner if needed.   Follow up in two weeks.   Chronic pain of left knee  Morbid obesity with BMI of 50.0-59.9, adult (HCC)  Primary osteoarthritis of left knee Assessment & Plan: Uncontrolled.   Only tried Celebrex for 3 weeks with no relief.   She will schedule follow up with Dr. Patsy Lager.   Class 3 severe obesity due to excess calories without serious comorbidity with body mass index (BMI) of 50.0 to 59.9 in adult [facility]) Assessment & Plan: Uncontrolled.   P.A. resubmitted for semaglutide as it was never completed.   Discussed at length with patient the importance of adherence to exercise regimen and monitoring her diet.   Discussed using the tools provided by the nutritionist.   She has tried Phentermine in the past and we may consider that as an option if GLP-1 does not get approved.    Return in about 2 weeks (around 02/05/2024) for physical and BP.    Modesto Charon, NP

## 2024-01-22 NOTE — Telephone Encounter (Signed)
 Pharmacy Patient Advocate Encounter   Received notification from Pt Calls Messages that prior authorization for Wegovy 0.25MG /0.5ML auto-injectors is required/requested.   Insurance verification completed.   The patient is insured through [facility] .   Per test claim: Patient must call [employer] [phone]

## 2024-01-22 NOTE — Assessment & Plan Note (Signed)
 BP at goal today.   Discussed checking BP at home for two weeks.   Given parameters for consistent readings to report back sooner if needed.   Follow up in two weeks.

## 2024-01-23 ENCOUNTER — Other Ambulatory Visit: Payer: Self-pay | Admitting: General Practice

## 2024-01-23 DIAGNOSIS — F329 Major depressive disorder, single episode, unspecified: Secondary | ICD-10-CM

## 2024-01-27 NOTE — Progress Notes (Unsigned)
   Megan Grunewald T. Majesta Leichter, MD, CAQ Sports Medicine [facility] at [facility] [address] [place], [address]  Phone: [phone]  FAX: [phone]  [name] - 53 y.o. female  MRN [id number]  Date of Birth: 01-Dec-1970  Date: 01/28/2024  PCP: Modesto Charon, NP  Referral: Modesto Charon, NP  No chief complaint on file.  Subjective:   [name] is a 53 y.o. very pleasant female patient with There is no height or weight on file to calculate BMI. who presents with the following:  I am seeing the patient today in follow-up chronic left-sided knee pain and chronic arthritis.  When I saw her last I did do some radiographs of the left knee, and she does have severe/advanced osteoarthritis of the medial compartment with more moderate overall.  The case is more challenging with a BMI of 61.  Last time I did give her some Celebrex, started her on Wegovy, and also did an intra-articular knee injection with corticosteroid.    Review of Systems is noted in the HPI, as appropriate  Objective:   There were no vitals taken for this visit.  GEN: No acute distress; alert,appropriate. PULM: Breathing comfortably in no respiratory distress PSYCH: Normally interactive.   Laboratory and Imaging Data:  Assessment and Plan:   ***

## 2024-01-28 ENCOUNTER — Ambulatory Visit (INDEPENDENT_AMBULATORY_CARE_PROVIDER_SITE_OTHER): Admitting: Family Medicine

## 2024-01-28 VITALS — BP 128/80 | HR 78 | Temp 98.1°F | Ht 64.75 in | Wt 367.0 lb

## 2024-01-28 DIAGNOSIS — M1712 Unilateral primary osteoarthritis, left knee: Secondary | ICD-10-CM | POA: Diagnosis not present

## 2024-01-28 DIAGNOSIS — Z6841 Body Mass Index (BMI) 40.0 and over, adult: Secondary | ICD-10-CM | POA: Diagnosis not present

## 2024-01-28 NOTE — Patient Instructions (Addendum)
 We will work on getting Viscosupplementation approved.  Consider bariatric surgery  Voltaren 1% gel, over the counter You can apply up to 4 times a day  This can be applied to any joint: knee, wrist, fingers, elbows, shoulders, feet and ankles. Can apply to any tendon: tennis elbow, achilles, tendon, rotator cuff or any other tendon.  Minimal is absorbed in the bloodstream: ok with oral anti-inflammatory or a blood thinner.  Cost is about 9 dollars

## 2024-01-29 ENCOUNTER — Encounter: Payer: Self-pay | Admitting: Family Medicine

## 2024-01-29 ENCOUNTER — Telehealth

## 2024-01-29 NOTE — Telephone Encounter (Signed)
 Request for coverage was sent on 3.11.25 to [employer]. Response was this patient is not covered for Orthovisc or Monovisc with her [employer] plan.  I reached out to [facility] and spoke with a representative named Ray, Ref# [id number] and he told me pre-authorization was not necessary because gel injections are not covered.   I then reached out to [employer], and the representative told me that [facility] does not cover Monovisc or Orthovisc injections most of the time, as opposed to [employer].

## 2024-01-29 NOTE — Telephone Encounter (Signed)
 Can we figure out if Durolane or Synvisc is covered?

## 2024-01-30 ENCOUNTER — Encounter: Payer: Self-pay | Admitting: *Deleted

## 2024-01-30 ENCOUNTER — Other Ambulatory Visit (HOSPITAL_COMMUNITY): Payer: Self-pay

## 2024-01-30 ENCOUNTER — Telehealth: Payer: Self-pay

## 2024-01-30 NOTE — Telephone Encounter (Signed)
 Follow up note sent to provider

## 2024-01-30 NOTE — Telephone Encounter (Signed)
 Please call  She has a high deductible plan - 3,000 deductible, so 100% of Visco cost would be her responsibility until her deductible is met.  She has a tough situation with her knee without an easy solution.  I am not sure that a repeat knee injection would help, since it did not help before.    She can continue to use oral and topical NSAIDS, which may help some.  I mentioned bariatric surgery consultation to her as an option, so I am happy to make that consult if she would like.

## 2024-01-31 NOTE — Telephone Encounter (Signed)
 Last read by Derry Lory at 11:25AM on 01/31/2024.

## 2024-02-04 NOTE — Telephone Encounter (Signed)
 I am not entirely sure, can you check and see if she wants an appointment for injection or does she want to check about FSA/visco first?  I am happy to do a steroid injection or have her f/u for visco.

## 2024-02-10 NOTE — Progress Notes (Unsigned)
   Arriona Prest T. Isais Klipfel, MD, CAQ Sports Medicine [facility] at [facility] [address] [place], [address]  Phone: [phone]  FAX: [phone]  [name] - 53 y.o. female  MRN [id number]  Date of Birth: [birth year]  Date: 02/11/2024  PCP: Modesto Charon, NP  Referral: Modesto Charon, NP  No chief complaint on file.  Subjective:   [name] is a 53 y.o. very pleasant female patient with There is no height or weight on file to calculate BMI. who presents with the following:  She is a pleasant patient who I saw a couple of weeks ago with some left-sided knee pain and left-sided knee osteoarthritis.  We did try to get her approved for viscosupplementation, but she was going to have to bear the large brunt of this cost.  She presents today again for repeat corticosteroid injection.    Review of Systems is noted in the HPI, as appropriate  Objective:   There were no vitals taken for this visit.  GEN: No acute distress; alert,appropriate. PULM: Breathing comfortably in no respiratory distress PSYCH: Normally interactive.   Laboratory and Imaging Data:  Assessment and Plan:   ***

## 2024-02-11 ENCOUNTER — Encounter: Payer: Self-pay | Admitting: Family Medicine

## 2024-02-11 ENCOUNTER — Encounter: Admitting: General Practice

## 2024-02-11 ENCOUNTER — Ambulatory Visit (INDEPENDENT_AMBULATORY_CARE_PROVIDER_SITE_OTHER): Admitting: Family Medicine

## 2024-02-11 VITALS — BP 160/98 | HR 78 | Temp 98.0°F | Ht 64.75 in | Wt 355.1 lb

## 2024-02-11 DIAGNOSIS — M170 Bilateral primary osteoarthritis of knee: Secondary | ICD-10-CM

## 2024-02-11 DIAGNOSIS — M1712 Unilateral primary osteoarthritis, left knee: Secondary | ICD-10-CM

## 2024-02-11 MED ORDER — TRIAMCINOLONE ACETONIDE 40 MG/ML IJ SUSP
40.0000 mg | Freq: Once | INTRAMUSCULAR | Status: AC
  Administered 2024-02-11: 40 mg via INTRA_ARTICULAR

## 2024-02-12 ENCOUNTER — Encounter: Payer: Self-pay | Admitting: Family Medicine

## 2024-06-27 ENCOUNTER — Other Ambulatory Visit: Payer: Self-pay | Admitting: General Practice

## 2024-06-27 MED ORDER — TRIAMCINOLONE ACETONIDE 0.1 % EX CREA: TOPICAL_CREAM | Freq: Every day | CUTANEOUS | 0 refills | Status: DC | PRN

## 2024-06-27 NOTE — Telephone Encounter (Unsigned)
 Copied from CRM [id number]. Topic: Clinical - Medication Refill >> Jun 27, 2024 12:22 PM Deleta S wrote: Medication: triamcinolone  cream (KENALOG ) 0.1 %  Has the patient contacted their pharmacy? Yes (Agent: If no, request that the patient contact the pharmacy for the refill. If patient does not wish to contact the pharmacy document the reason why and proceed with request.) (Agent: If yes, when and what did the pharmacy advise?)  This is the patient's preferred pharmacy:  [phone] [address] [place] [address] Phone: [phone] Fax: [phone]  Is this the correct pharmacy for this prescription? Yes If no, delete pharmacy and type the correct one.   Has the prescription been filled recently? No  Is the patient out of the medication? Yes  Has the patient been seen for an appointment in the last year OR does the patient have an upcoming appointment? Yes  Can we respond through MyChart? Yes  Agent: Please be advised that Rx refills may take up to 3 business days. We ask that you follow-up with your pharmacy.

## 2024-10-13 LAB — BASIC METABOLIC PANEL WITH GFR
BUN: 15 (ref 4–21)
Chloride: 108 (ref 99–108)
Creatinine: 1.1 (ref 0.5–1.1)
Glucose: 83
Potassium: 4.2 meq/L (ref 3.5–5.1)
Sodium: 144 (ref 137–147)

## 2024-10-13 LAB — LIPID PANEL
Cholesterol: 143 (ref 0–200)
HDL: 42 (ref 35–70)
LDL Cholesterol: 85
Triglycerides: 86 (ref 40–160)

## 2024-10-13 LAB — HEMOGLOBIN A1C: Hemoglobin A1C: 4.8

## 2024-10-13 LAB — TSH: TSH: 2.12 (ref 0.41–5.90)

## 2024-10-26 ENCOUNTER — Other Ambulatory Visit: Payer: Self-pay | Admitting: General Practice

## 2024-10-26 DIAGNOSIS — F329 Major depressive disorder, single episode, unspecified: Secondary | ICD-10-CM

## 2024-10-27 NOTE — Telephone Encounter (Signed)
 Patient was supposed to come back and see [place] for a CPE and never did. Patient needs to be scheduled before refills can be given.

## 2024-10-30 NOTE — Telephone Encounter (Signed)
LMTCB to schedule CPE °

## 2024-11-03 NOTE — Telephone Encounter (Signed)
 Patient needs a CPE scheduled

## 2024-11-03 NOTE — Telephone Encounter (Signed)
 lvm for pt to call office to schedule appt.

## 2024-11-05 NOTE — Telephone Encounter (Signed)
 CPE scheduled for 11/25/24

## 2024-11-15 NOTE — Progress Notes (Unsigned)
" ° ° ° °  Ilan Kahrs T. Calinda Stockinger, MD, CAQ Sports Medicine [facility] at [facility] [address] [place], [address]  Phone: [phone]  FAX: [phone]  [name] - 53 y.o. female  MRN [id number]  Date of Birth: 24-Oct-1971  Date: 11/17/2024  PCP: Vincente Shivers, NP  Referral: Vincente Shivers, NP  No chief complaint on file.  Subjective:   [name] is a 53 y.o. very pleasant female patient with There is no height or weight on file to calculate BMI. who presents with the following:  Discussed the use of AI scribe software for clinical note transcription with the patient, who gave verbal consent to proceed.  Pleasant young patients with knee osteoarthritis.  I last saw her March 2025, and at that point we did do bilateral corticosteroid injections. History of Present Illness     Review of Systems is noted in the HPI, as appropriate  Objective:   There were no vitals taken for this visit.  GEN: No acute distress; alert,appropriate. PULM: Breathing comfortably in no respiratory distress PSYCH: Normally interactive.   Laboratory and Imaging Data:  Assessment and Plan:   No diagnosis found. Assessment & Plan   Medication Management during today's office visit: No orders of the defined types were placed in this encounter.  There are no discontinued medications.  Orders placed today for conditions managed today: No orders of the defined types were placed in this encounter.   Disposition: No follow-ups on file.  [employer] speech-to-text software was used for transcription in this dictation.  Possible transcriptional errors can occur using Animal nutritionist.   Signed,  Jacques DASEN. Subrina Vecchiarelli, MD   Outpatient Encounter Medications as of 11/17/2024  Medication Sig   buPROPion  (WELLBUTRIN  XL) 300 MG 24 hr tablet TAKE 1 TABLET BY MOUTH EVERY DAY   Magnesium 250 MG CAPS Take by mouth.   Multiple Vitamin (MULTIVITAMIN) tablet Take 1 tablet  by mouth daily.   triamcinolone  cream (KENALOG ) 0.1 % Apply topically daily as needed.   No facility-administered encounter medications on file as of 11/17/2024.   "

## 2024-11-17 ENCOUNTER — Ambulatory Visit (INDEPENDENT_AMBULATORY_CARE_PROVIDER_SITE_OTHER): Admitting: Family Medicine

## 2024-11-17 ENCOUNTER — Encounter: Payer: Self-pay | Admitting: Family Medicine

## 2024-11-17 VITALS — BP 132/84 | HR 75 | Temp 99.2°F | Ht 64.75 in | Wt 252.0 lb

## 2024-11-17 DIAGNOSIS — M170 Bilateral primary osteoarthritis of knee: Secondary | ICD-10-CM | POA: Diagnosis not present

## 2024-11-17 MED ORDER — TRIAMCINOLONE ACETONIDE 40 MG/ML IJ SUSP
40.0000 mg | Freq: Once | INTRAMUSCULAR | Status: AC
  Administered 2024-11-17: 40 mg via INTRA_ARTICULAR

## 2024-11-25 ENCOUNTER — Encounter: Payer: Self-pay | Admitting: General Practice

## 2024-11-25 ENCOUNTER — Ambulatory Visit: Admitting: General Practice

## 2024-11-25 ENCOUNTER — Ambulatory Visit: Payer: Self-pay | Admitting: General Practice

## 2024-11-25 VITALS — BP 138/76 | HR 77 | Temp 98.2°F | Ht 64.0 in | Wt 248.0 lb

## 2024-11-25 DIAGNOSIS — E66813 Obesity, class 3: Secondary | ICD-10-CM | POA: Diagnosis not present

## 2024-11-25 DIAGNOSIS — Z0000 Encounter for general adult medical examination without abnormal findings: Secondary | ICD-10-CM | POA: Diagnosis not present

## 2024-11-25 DIAGNOSIS — Z6841 Body Mass Index (BMI) 40.0 and over, adult: Secondary | ICD-10-CM

## 2024-11-25 DIAGNOSIS — M1712 Unilateral primary osteoarthritis, left knee: Secondary | ICD-10-CM

## 2024-11-25 DIAGNOSIS — F329 Major depressive disorder, single episode, unspecified: Secondary | ICD-10-CM | POA: Diagnosis not present

## 2024-11-25 DIAGNOSIS — Z5320 Procedure and treatment not carried out because of patient's decision for unspecified reasons: Secondary | ICD-10-CM | POA: Diagnosis not present

## 2024-11-25 DIAGNOSIS — Z23 Encounter for immunization: Secondary | ICD-10-CM | POA: Diagnosis not present

## 2024-11-25 DIAGNOSIS — L409 Psoriasis, unspecified: Secondary | ICD-10-CM | POA: Diagnosis not present

## 2024-11-25 DIAGNOSIS — R030 Elevated blood-pressure reading, without diagnosis of hypertension: Secondary | ICD-10-CM | POA: Diagnosis not present

## 2024-11-25 LAB — BASIC METABOLIC PANEL WITH GFR
BUN: 17 mg/dL (ref 6–23)
CO2: 28 meq/L (ref 19–32)
Calcium: 9.3 mg/dL (ref 8.4–10.5)
Chloride: 106 meq/L (ref 96–112)
Creatinine, Ser: 1.05 mg/dL (ref 0.40–1.20)
GFR: 60.67 mL/min
Glucose, Bld: 83 mg/dL (ref 70–99)
Potassium: 4.4 meq/L (ref 3.5–5.1)
Sodium: 140 meq/L (ref 135–145)

## 2024-11-25 LAB — CBC
HCT: 42.6 % (ref 36.0–46.0)
Hemoglobin: 14.4 g/dL (ref 12.0–15.0)
MCHC: 33.8 g/dL (ref 30.0–36.0)
MCV: 88 fl (ref 78.0–100.0)
Platelets: 281 K/uL (ref 150.0–400.0)
RBC: 4.84 Mil/uL (ref 3.87–5.11)
RDW: 14.1 % (ref 11.5–15.5)
WBC: 10 K/uL (ref 4.0–10.5)

## 2024-11-25 MED ORDER — TRIAMCINOLONE ACETONIDE 0.1 % EX CREA: TOPICAL_CREAM | Freq: Every day | CUTANEOUS | 0 refills | Status: AC | PRN

## 2024-11-25 MED ORDER — BUPROPION HCL ER (XL) 300 MG PO TB24: 300.0000 mg | ORAL_TABLET | Freq: Every day | ORAL | 2 refills | Status: AC

## 2024-11-25 NOTE — Assessment & Plan Note (Signed)
 Immunizations tdap UTD; 2nd dose of shingrix  given today; prevnar given today; declines influenza vaccine. Pap smear UTD Mammogram due- discussed at length especially due to family hx; patient declines screening at this time. Colorectal cancer screening due- declines at this time.  Discussed the importance of a healthy diet and regular exercise in order for weight loss, and to reduce the risk of further co-morbidity.  Exam stable. Labs pending. Reviewed labs done in November.   Follow up in 1 year for repeat physical.

## 2024-11-25 NOTE — Assessment & Plan Note (Signed)
Improved since weight loss 

## 2024-11-25 NOTE — Patient Instructions (Signed)
 Stop by the lab prior to leaving today. I will notify you of your results once received.   Follow up in one year for physical.   It was a pleasure to see you today!

## 2024-11-25 NOTE — Assessment & Plan Note (Signed)
 Controlled.  Continue Triamcinolone  cream 0.1%.  Refill sent.

## 2024-11-25 NOTE — Progress Notes (Signed)
 "  Established Patient Office Visit  Subjective   Patient ID: [name], female    DOB: 11/21/1970  Age: 54 y.o. MRN: [id number]  Chief Complaint  Patient presents with   Annual Exam    HPI  [name] is a 54 year old female with past medical history of morbid obesity, psoriasis, OA, depression presents today for complete physical and follow up of chronic conditions.  Immunizations: -Tetanus: Completed in 2023 -Influenza: due -Shingles: Completed 1st dose of Shingrix  series -Pneumonia: due  Diet: Fair diet.  Exercise:  regular exercise.  Eye exam: Completed several years ago.  Dental exam: Completes semi-annually   Pap Smear: Completed in 2023 Mammogram: none; family history of breast cancer in maternal grandmother and maternal aunt.  Colonoscopy: due; declines; no known family history of colon cancer.    Patient Active Problem List   Diagnosis Date Noted   Encounter for screening and preventative care 11/25/2024   Elevated blood pressure reading 01/22/2024   Class 3 severe obesity due to excess calories without serious comorbidity with body mass index (BMI) of 50.0 to 59.9 in adult [facility]) 10/24/2023   Primary osteoarthritis of left knee 10/24/2023   Psoriasis 05/10/2022   Recurrent major depressive disorder, in partial remission 05/10/2022   Past Medical History:  Diagnosis Date   Depression    Mental disorder    Stress incontinence 05/10/2022   Past Surgical History:  Procedure Laterality Date   CESAREAN SECTION     x3   Family History  Problem Relation Age of Onset   Dementia Mother    Depression Sister    Breast cancer Maternal Aunt    Breast cancer Maternal Grandmother    Kidney disease Maternal Grandfather    Allergies[1]       11/25/2024   11:55 AM 11/17/2024   11:42 AM 01/22/2024   10:51 AM  Depression screen PHQ 2/9  Decreased Interest 0 0 2  Down, Depressed, Hopeless 0 0 0  PHQ - 2 Score 0 0 2  Altered sleeping 0  0  Tired, decreased  energy 0  2  Change in appetite 0  3  Feeling bad or failure about yourself  0  3  Trouble concentrating 0  0  Moving slowly or fidgety/restless 0  0  Suicidal thoughts 0  0  PHQ-9 Score 0  10   Difficult doing work/chores Not difficult at all  Somewhat difficult     Data saved with a previous flowsheet row definition       11/25/2024   11:55 AM 01/22/2024   10:51 AM 10/24/2023    3:03 PM  GAD 7 : Generalized Anxiety Score  Nervous, Anxious, on Edge 0 0 0  Control/stop worrying 0 0 0  Worry too much - different things 0 0 0  Trouble relaxing 0 0 0  Restless 0 0 0  Easily annoyed or irritable 0 0 0  Afraid - awful might happen 0 0 0  Total GAD 7 Score 0 0 0  Anxiety Difficulty Not difficult at all Not difficult at all Not difficult at all      Review of Systems  Constitutional:  Negative for chills, fever, malaise/fatigue and weight loss.  HENT:  Negative for congestion, ear discharge, ear pain, hearing loss, nosebleeds, sinus pain, sore throat and tinnitus.   Eyes:  Negative for blurred vision, double vision, pain, discharge and redness.  Respiratory:  Negative for cough, shortness of breath, wheezing and stridor.   Cardiovascular:  Negative for chest pain, palpitations and leg swelling.  Gastrointestinal:  Negative for abdominal pain, constipation, diarrhea, heartburn, nausea and vomiting.  Genitourinary:  Negative for dysuria, frequency and urgency.  Musculoskeletal:  Negative for myalgias.  Skin:  Negative for rash.  Neurological:  Negative for dizziness, tingling, seizures, weakness and headaches.  Endo/Heme/Allergies:  Negative for polydipsia.  Psychiatric/Behavioral:  Negative for depression, substance abuse and suicidal ideas. The patient is not nervous/anxious.       Objective:     BP 138/76   Pulse 77   Temp 98.2 F (36.8 C) (Oral)   Ht 5' 4 (1.626 m)   Wt 248 lb (112.5 kg)   LMP  (LMP Unknown)   SpO2 98%   BMI 42.57 kg/m  BP Readings from Last 3  Encounters:  11/25/24 138/76  11/17/24 132/84  02/11/24 (!) 160/98   Wt Readings from Last 3 Encounters:  11/25/24 248 lb (112.5 kg)  11/17/24 252 lb (114.3 kg)  02/11/24 (!) 355 lb 2 oz (161.1 kg)      Physical Exam Vitals and nursing note reviewed.  Constitutional:      Appearance: Normal appearance.  HENT:     Head: Normocephalic and atraumatic.     Right Ear: Tympanic membrane, ear canal and external ear normal.     Left Ear: Tympanic membrane, ear canal and external ear normal.     Nose: Nose normal.     Mouth/Throat:     Mouth: Mucous membranes are moist.     Pharynx: Oropharynx is clear.  Eyes:     Conjunctiva/sclera: Conjunctivae normal.     Pupils: Pupils are equal, round, and reactive to light.  Cardiovascular:     Rate and Rhythm: Normal rate and regular rhythm.     Pulses: Normal pulses.     Heart sounds: Normal heart sounds.  Pulmonary:     Effort: Pulmonary effort is normal.     Breath sounds: Normal breath sounds.  Abdominal:     General: Abdomen is flat. Bowel sounds are normal.     Palpations: Abdomen is soft.  Musculoskeletal:        General: Normal range of motion.     Cervical back: Normal range of motion.  Skin:    General: Skin is warm and dry.     Capillary Refill: Capillary refill takes less than 2 seconds.  Neurological:     General: No focal deficit present.     Mental Status: She is alert and oriented to person, place, and time. Mental status is at baseline.  Psychiatric:        Mood and Affect: Mood normal.        Behavior: Behavior normal.        Thought Content: Thought content normal.        Judgment: Judgment normal.      No results found for any visits on 11/25/24.     The ASCVD Risk score (Arnett DK, et al., 2019) failed to calculate for the following reasons:   Cannot find a previous HDL lab   Cannot find a previous total cholesterol lab   * - Cholesterol units were assumed    Assessment & Plan:  Encounter for  screening and preventative care Assessment & Plan: Immunizations tdap UTD; 2nd dose of shingrix  given today; prevnar given today; declines influenza vaccine. Pap smear UTD Mammogram due- discussed at length especially due to family hx; patient declines screening at this time. Colorectal cancer screening due- declines at  this time.  Discussed the importance of a healthy diet and regular exercise in order for weight loss, and to reduce the risk of further co-morbidity.  Exam stable. Labs pending. Reviewed labs done in November.   Follow up in 1 year for repeat physical.   Orders: -     CBC -     Basic metabolic panel with GFR  Major depressive disorder, remission status unspecified, unspecified whether recurrent -     buPROPion  HCl ER (XL); Take 1 tablet (300 mg total) by mouth daily.  Dispense: 90 tablet; Refill: 2  Psoriasis Assessment & Plan: Controlled.  Continue Triamcinolone  cream 0.1%.  Refill sent.  Orders: -     Triamcinolone  Acetonide; Apply topically daily as needed.  Dispense: 30 g; Refill: 0  Need for pneumococcal vaccination -     Pneumococcal conjugate vaccine 20-valent  Mammogram declined  Need for shingles vaccine -     Varicella-zoster vaccine IM  Class 3 severe obesity due to excess calories without serious comorbidity with body mass index (BMI) of 50.0 to 59.9 in adult [facility]) Assessment & Plan: Improving.  Commended patient on her progress.   Continue Wegovy  per health coach through her work.   Elevated blood pressure reading Assessment & Plan: Improved since weight loss.   Primary osteoarthritis of left knee Assessment & Plan: Following with ortho and doing injections.  Symptoms have improved slightly.    Return in about 1 year (around 11/25/2025) for physical and fasting labs. SABRA Carrol Aurora, NP     [1] No Known Allergies  "

## 2024-11-25 NOTE — Assessment & Plan Note (Signed)
" >>  ASSESSMENT AND PLAN FOR DEPRESSION, MAJOR WRITTEN ON 11/25/2024  1:41 PM BY VINCENTE SHIVERS, NP  Controlled.  Continue Wellbutrin  300 mg once daily. Refill sent. "

## 2024-11-25 NOTE — Assessment & Plan Note (Signed)
 Improving.  Commended patient on her progress.   Continue Wegovy  per health coach through her work.

## 2024-11-25 NOTE — Assessment & Plan Note (Signed)
 Following with ortho and doing injections.  Symptoms have improved slightly.

## 2024-11-25 NOTE — Assessment & Plan Note (Signed)
 Controlled.  Continue Wellbutrin  300 mg once daily. Refill sent.

## 2024-12-09 LAB — COMPREHENSIVE METABOLIC PANEL WITH GFR
A1c: 4.8
Albumin: 4.2 (ref 3.5–5.0)
Calcium: 9.5 (ref 8.7–10.7)
Globulin: 2.1
eGFR: 59
